# Patient Record
Sex: Male | Born: 1940 | Race: White | Hispanic: No | Marital: Married | Smoking: Former smoker
Health system: Southern US, Community
[De-identification: ages and names within clinical notes are randomized; demographics above are authoritative.]

## PROBLEM LIST (undated history)

## (undated) DIAGNOSIS — C61 Malignant neoplasm of prostate: Secondary | ICD-10-CM

## (undated) DIAGNOSIS — I2119 ST elevation (STEMI) myocardial infarction involving other coronary artery of inferior wall: Secondary | ICD-10-CM

## (undated) DIAGNOSIS — I319 Disease of pericardium, unspecified: Secondary | ICD-10-CM

## (undated) DIAGNOSIS — M1990 Unspecified osteoarthritis, unspecified site: Secondary | ICD-10-CM

## (undated) DIAGNOSIS — E785 Hyperlipidemia, unspecified: Secondary | ICD-10-CM

## (undated) DIAGNOSIS — R0602 Shortness of breath: Secondary | ICD-10-CM

## (undated) DIAGNOSIS — I10 Essential (primary) hypertension: Secondary | ICD-10-CM

## (undated) DIAGNOSIS — Z8679 Personal history of other diseases of the circulatory system: Secondary | ICD-10-CM

## (undated) DIAGNOSIS — E669 Obesity, unspecified: Secondary | ICD-10-CM

## (undated) HISTORY — PX: PROSTATECTOMY: SHX69

---

## 1997-08-25 ENCOUNTER — Emergency Department (HOSPITAL_COMMUNITY): Admission: EM | Admit: 1997-08-25 | Discharge: 1997-08-25 | Payer: Self-pay | Admitting: Emergency Medicine

## 2002-04-25 DIAGNOSIS — I319 Disease of pericardium, unspecified: Secondary | ICD-10-CM

## 2002-04-25 HISTORY — DX: Disease of pericardium, unspecified: I31.9

## 2002-04-25 HISTORY — PX: OTHER SURGICAL HISTORY: SHX169

## 2002-11-04 DIAGNOSIS — Z8679 Personal history of other diseases of the circulatory system: Secondary | ICD-10-CM

## 2002-11-04 HISTORY — DX: Personal history of other diseases of the circulatory system: Z86.79

## 2002-11-20 ENCOUNTER — Encounter: Payer: Self-pay | Admitting: Cardiology

## 2002-11-20 ENCOUNTER — Encounter: Payer: Self-pay | Admitting: Family Medicine

## 2002-11-20 ENCOUNTER — Inpatient Hospital Stay (HOSPITAL_COMMUNITY): Admission: AD | Admit: 2002-11-20 | Discharge: 2002-11-23 | Payer: Self-pay | Admitting: Emergency Medicine

## 2002-11-20 ENCOUNTER — Encounter: Payer: Self-pay | Admitting: Emergency Medicine

## 2002-11-21 ENCOUNTER — Encounter (INDEPENDENT_AMBULATORY_CARE_PROVIDER_SITE_OTHER): Payer: Self-pay | Admitting: Cardiology

## 2002-12-03 ENCOUNTER — Encounter (INDEPENDENT_AMBULATORY_CARE_PROVIDER_SITE_OTHER): Payer: Self-pay | Admitting: *Deleted

## 2002-12-03 ENCOUNTER — Encounter: Payer: Self-pay | Admitting: Cardiology

## 2002-12-03 ENCOUNTER — Encounter (INDEPENDENT_AMBULATORY_CARE_PROVIDER_SITE_OTHER): Payer: Self-pay | Admitting: Cardiology

## 2002-12-03 ENCOUNTER — Inpatient Hospital Stay (HOSPITAL_COMMUNITY): Admission: AD | Admit: 2002-12-03 | Discharge: 2002-12-11 | Payer: Self-pay | Admitting: Cardiology

## 2002-12-03 ENCOUNTER — Encounter: Payer: Self-pay | Admitting: Thoracic Surgery (Cardiothoracic Vascular Surgery)

## 2002-12-04 ENCOUNTER — Encounter: Payer: Self-pay | Admitting: Cardiology

## 2002-12-06 ENCOUNTER — Encounter: Payer: Self-pay | Admitting: Cardiology

## 2002-12-09 ENCOUNTER — Encounter (INDEPENDENT_AMBULATORY_CARE_PROVIDER_SITE_OTHER): Payer: Self-pay | Admitting: Cardiology

## 2003-01-14 ENCOUNTER — Encounter: Payer: Self-pay | Admitting: Thoracic Surgery (Cardiothoracic Vascular Surgery)

## 2003-01-14 ENCOUNTER — Encounter
Admission: RE | Admit: 2003-01-14 | Discharge: 2003-01-14 | Payer: Self-pay | Admitting: Thoracic Surgery (Cardiothoracic Vascular Surgery)

## 2003-02-25 ENCOUNTER — Ambulatory Visit (HOSPITAL_COMMUNITY): Admission: RE | Admit: 2003-02-25 | Discharge: 2003-02-25 | Payer: Self-pay | Admitting: Urology

## 2003-05-05 ENCOUNTER — Encounter (INDEPENDENT_AMBULATORY_CARE_PROVIDER_SITE_OTHER): Payer: Self-pay | Admitting: Specialist

## 2003-05-05 ENCOUNTER — Inpatient Hospital Stay (HOSPITAL_COMMUNITY): Admission: RE | Admit: 2003-05-05 | Discharge: 2003-05-08 | Payer: Self-pay | Admitting: Urology

## 2003-11-25 ENCOUNTER — Ambulatory Visit: Admission: RE | Admit: 2003-11-25 | Discharge: 2004-02-23 | Payer: Self-pay | Admitting: Radiation Oncology

## 2004-03-23 ENCOUNTER — Ambulatory Visit: Admission: RE | Admit: 2004-03-23 | Discharge: 2004-03-23 | Payer: Self-pay | Admitting: Radiation Oncology

## 2004-08-17 ENCOUNTER — Ambulatory Visit: Admission: RE | Admit: 2004-08-17 | Discharge: 2004-08-17 | Payer: Self-pay | Admitting: Radiation Oncology

## 2005-03-18 ENCOUNTER — Encounter (HOSPITAL_COMMUNITY): Admission: RE | Admit: 2005-03-18 | Discharge: 2005-04-19 | Payer: Self-pay | Admitting: Urology

## 2005-09-13 ENCOUNTER — Ambulatory Visit (HOSPITAL_COMMUNITY): Admission: RE | Admit: 2005-09-13 | Discharge: 2005-09-13 | Payer: Self-pay | Admitting: Urology

## 2005-12-15 ENCOUNTER — Ambulatory Visit (HOSPITAL_COMMUNITY): Admission: RE | Admit: 2005-12-15 | Discharge: 2005-12-15 | Payer: Self-pay | Admitting: Urology

## 2009-02-22 ENCOUNTER — Emergency Department (HOSPITAL_COMMUNITY): Admission: EM | Admit: 2009-02-22 | Discharge: 2009-02-22 | Payer: Self-pay | Admitting: Emergency Medicine

## 2010-05-15 ENCOUNTER — Encounter: Payer: Self-pay | Admitting: Urology

## 2010-09-10 NOTE — Discharge Summary (Signed)
NAME:  George French, George French NO.:  [id number]   MEDICAL RECORD NO.:  [id number]                   PATIENT TYPE:  INP   LOCATION:  3733                                 FACILITY:  [facility]   PHYSICIAN:  Darden Palmer., M.D.         DATE OF BIRTH:  10/19/1940   DATE OF ADMISSION:  12/03/2002  DATE OF DISCHARGE:  12/11/2002                                 DISCHARGE SUMMARY   FINAL DIAGNOSES:  1. Pericardial tamponade.     a. Suspected viral pericarditis recently.  2. Mild hyperlipidemia.  3. Cigarette abuse with chronic obstructive pulmonary disease.   PROCEDURE:  1. Emergency echocardiogram.  2. Creation of pericardial window by Salvatore Decent. Dorris Fetch, M.D.   HISTORY:  This 70 year old male developed pericarditis July 28.  Was  hospitalized previously.  He developed EKG changes consistent with  pericarditis, had atrial fibrillation, and had a CT scan showing a small  effusion.  During the work-up he had a slightly elevated sedimentation rate  and slightly elevated white count but no other cause for the effusion could  be found.  He was discharged home on high dose aspirin and subsequent follow-  up echocardiogram after the initial showed only a small to moderate  effusion.  He had felt well initially for a week but four days prior to  admission developed increasing shortness of breath, PND, orthopnea, as well  as increasing abdominal girth and a feeling of tightness or fullness in his  neck.  When seen in the office for follow-up he was found to have neck vein  distention, was suspected in being in tamponade, and was brought to the  hospital.  Please see the previously dictated history and physical for  remainder of the details.   HOSPITAL COURSE:  Laboratory data on admission showed a white count of  15,000, hemoglobin 12.3, hematocrit 36.6.  White count was __________ .  PT  and PTT were normal.  Glucose 140, BUN 24, creatinine 0.9.  Liver enzymes  were normal except for slightly elevated alkaline phosphatase.  He had a CT  scan showing a large effusion and echocardiogram also showed a large  effusion with tamponade with RV and RA collapse.  He was considered for an  echocardiogram guided pericardiocentesis, but did not have optimal anatomy  and thus was referred for a subxiphoid pericardial window which was done the  day of admission by Viviann Spare C. Dorris Fetch, M.D.  He had removal of  significant amounts of bloody fluid and the pericardia was also felt to be  slightly thickened.  The fluid was an exudate and was bloody.  Neutrophils  were 47%, lymphocytes 41%, monocytes 11%.  LDH was 484.  Amylase was 33.  Initial cultures showed no growth of three days and tissue culture showed no  growth.  ANA was undetectable.  Viral cultures were pending.  Pathology was  not back on the chart at the time  of discharge and will be followed up.  Following the surgery he improved, but had significant fluid retention  requiring Lasix for administration.  Lasix was administered and he was  diuresed to a weight of 217.  He also developed a significant sore throat  and had to have some __________ for that.  He had some transient atrial  fibrillation following the surgery and was continued on his Diltiazem.  He  gradually improved, however, and was discharged on December 11, 2002 in  improved condition.   ACTIVITY:  He is instructed to stay out of work for the time being.  He is  not to do heavy lifting and is to restrict his driving for several weeks.   DISCHARGE MEDICATIONS:  1. Aspirin two tablets t.i.d.  2. Colchicine 0.6 mg b.i.d.  3. Cardizem LA 240 mg daily.  4. Lasix 40 mg daily for two weeks.  5. Potassium 40 mEq daily.   FOLLOWUP:  He is to call me for an appointment in two weeks, to see Viviann Spare  C. Dorris Fetch, M.D. in four weeks.                                                Darden Palmer., M.D.    WST/MEDQ  D:  12/11/2002  T:   12/12/2002  Job:  [id number]   cc:   Teena Irani. Arlyce Dice, M.D.  [address]  [place]  [place] [address]  Fax: [phone]   Salvatore Decent. Dorris Fetch, M.D.  [address]  [place]  [place] [address]  Fax: [phone]

## 2010-09-10 NOTE — Consult Note (Signed)
NAME:  George French, George French NO.:  [id number]   MEDICAL RECORD NO.:  [id number]                   PATIENT TYPE:  INP   LOCATION:  3302                                 FACILITY:  [facility]   PHYSICIAN:  Darden Palmer., M.D.         DATE OF BIRTH:  06/12/1940   DATE OF CONSULTATION:  11/20/2002  DATE OF DISCHARGE:                                   CONSULTATION   CARDIAC CONSULTATION   REQUESTING PHYSICIAN:  Teena Irani. Arlyce Dice, M.D.   Thank you for asking me to see this 70 year old male for evaluation of chest  pain.  He began to develop midsternal chest pain radiating to the right arm  which would come and go with no real associated precipitating or relieving  factors in December 2003. He felt that the symptoms have may been worse with  shoveling snow, but noted that it was not consistently related to activity  and no exacerbations __________ or diaphoresis.  It would last for variable  periods of time and may last up to half a day at a time. He had no  diaphoresis or sweating.   He had a positive family history of heart disease and smoking and got very  little exercise.  He had an exercise treadmill test done on January 27,  walking only 4 minutes and 45 seconds to Bruce stage 2 with very poor  exercise tolerance.  He had marked __________ fatigue with no angina and had  no ST segment depression consistent with ischemia.  At the time he had no  ischemia and it was recommended that if he continued to have chest pain that  I would be glad to see him again, if needed.  He was also advised to stop  smoking.   He was at his work place last evening and had the onset of sharp severe pain  that started while at work. Described as a sharp pain that was extremely  pleuritic and he could not take a deep breath.  These symptoms intensified  overnight and he was admitted in the early morning hours. He described this  pain as pleuritic and intense associated with  severe weakness. He was  brought to the emergency room and the pain was also positional. It was not  associated with sweating. It was not described as pressure.  Three initial  sets of cardiac enzymes were negative and an EKG showed diffuse ST  elevation. He was admitted to the hospital last night and given a single  dose of Lovenox.  Since admission he developed some transient slowing of the  heart rate in the early morning hours around 5 a.m.  This was associated  with junctional rhythm and later developed some atrial fibrillation which  has been paroxysmal throughout the day.  He had some hypotension, earlier,  also in the morning associated with pain as well as some nausea.  He was  given some  intravenous fluids and had a spiral CT scan that did not show  evidence of a dissection, but did show a small pericardial effusion.  A  subsequent echocardiogram showed a small effusion without tamponade.   The patient continues to complain of severe pain which is worse when he lies  down and is severely pleuritic.  He states that he could take a deep breath  if it did not hurt so bad. He denies any recent upper respiratory infections  or viral illnesses.   PAST HISTORY:  Medical: No history of hypertension or diabetes.  He does  have mild hyperlipidemia.  Previous surgery: None.   ALLERGIES:  None.   MEDICATIONS PRIOR TO ADMISSION:  None.   FAMILY HISTORY:  Father, age 20, died of myocardial infarction and melanoma.  Mother, age 23, died of an MI.  One brother, age 37, is alive and well.   SOCIAL HISTORY:  He is divorced, but has been remarried for 10 years.  He  has smoked a pack of cigarettes per day and has greater than a 50-pack-year  history.  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 He is a native  of [place], but moved here to live with his wife.   REVIEW OF SYSTEMS:  He has been obese, but denies any recent weight change.  He denies any rashes or skin lesions. he denies  any ENT problems, history of  glaucoma or visual problems.  He has not had any recent sore throat. He does  have some dyspnea on exertion previously and states that his pain is very  pleuritic now.  CARDIOVASCULAR: Review HPI.  ABDOMEN: He has had some nausea  recently, but denies dyspepsia, ulcers or GI bleeding.  GU: He has a prior  history of an elevated PSA that has been treated.  He also has a history of  mild generalized arthritis. He denies any history of depression or change in  cognitive functions or history of headache.   PHYSICAL EXAMINATION:  GENERAL: On examination he is a pleasant male who is  mildly obese who is in moderate chest pain.  VITAL SIGNS:  His blood pressure was 117/75.  He was in atrial fibrillation  at the time and it was difficult to tell if he had any pulsus paradoxus or  not.  Pulse is currently 120.  SKIN: Warm and dry without obvious mass or lesion.  HEENT:  PERLA.  TMs are clear.  Pharynx negative.  NECK:  Supple without masses., JVD, or thyromegaly.  LUNGS:  Clear bilaterally.  He was unable to take a deep breath due to pain.  CARDIAC:  Exam shows normal S1 and S2, irregular rhythm.  There is a  scratchy murmur at the left sternal border.  ABDOMEN:  Soft, obese, and nontender.  EXTREMITIES:  Femoral pulse is 2+.  Feet are somewhat cold and peripheral  pulses are 1+.  There is brawny discoloration over the anterior shins.  NEUROLOGIC: Normal.   A 12-lead EKG shows atrial fibrillation initially.  He is sinus rhythm  initially with diffuse ST elevation consistent with pericarditis.  Repeat  EKG shows atrial fibrillation and a subsequent repeat showed him to be back  in sinus rhythm with diffuse ST elevation noted.  Chest x-ray showed mild  cardiomegaly.   LABORATORY DATA:  Shows a white count of 12,800.  All serial enzymes were  negative. Glucose was 160.   IMPRESSION:  1. Chest pain with features that suggest pericarditis.  The differential     possibility would be infectious due to a viral illness, cancer, or     inflammatory.  He did not appear to have any other comorbidities that     would go along with this.  2. Prior evaluation for chest pain.  3. History of chronic obstructive pulmonary disease and cigarette abuse.  4. Atrial fibrillation which is likely due to pericarditis.  5. Probable vagal reaction earlier in the morning with hypertension and     bradycardia.   RECOMMENDATIONS:  1. The patient appears to clinically have pericarditis.  2. It is recommended that all anticoagulation be stopped and not started.  I     would also avoid nitroglycerin or other agents that would lower blood     pressure and hydrate with intravenous fluids.  3. I would treat him with aspirin 3 tablets q.6h. in the form of high-dose     aspirin and check a follow up echogram in the morning.  4.     I would also evaluate a TSH, sedimentation rate, and may consider a TB test      although he does not have any symptoms that would suggest this and this     presentation would be unlikely.  I will follow him along with you and     appreciate seeing this nice man with you.                                               Darden Palmer., M.D.    WST/MEDQ  D:  11/20/2002  T:  11/20/2002  Job:  [id number]   cc:   Teena Irani. Arlyce Dice, M.D.  [address]  [place]  [place] [address]  Fax: [phone]

## 2010-09-10 NOTE — Discharge Summary (Signed)
NAME:  George French, George French                        ACCOUNT NO.:  [id number]   MEDICAL RECORD NO.:  [id number]                   PATIENT TYPE:  INP   LOCATION:  0382                                 FACILITY:  [facility]   PHYSICIAN:  Lindaann Slough, M.D.               DATE OF BIRTH:  08-27-1940   DATE OF ADMISSION:  05/05/2003  DATE OF DISCHARGE:  05/08/2003                                 DISCHARGE SUMMARY   DISCHARGE DIAGNOSES:  Adenocarcinoma of prostate.   PROCEDURE DONE:  Bilateral pelvic lymphadenectomy and radical prostatectomy  on May 05, 2003.   HISTORY OF PRESENT ILLNESS:  The patient is a 70 year old male who was found  to have an elevated PSA at 4.7.  A rectal examination showed an enlarged  prostate with some firmness on the right lobe of the prostate.  An  ultrasound biopsy of the prostate was positive for adenocarcinoma Gleason  score 7 and 8.  Bone scan is negative for metastatic disease.  The patient  and his wife chose to have a radical prostatectomy and he was admitted on  January 10 for the procedure.   PHYSICAL EXAMINATION:  VITAL SIGNS:  His blood pressure was 120/70, pulse  88, respirations 18, temperature 97.1.  HEENT:  Head is normal.  Pupils equal and reactive to light and  accommodation.  Ears, nose, and throat within normal limits.  NECK:  Supple.  No cervical lymph nodes.  No thyromegaly.  CHEST:  Symmetrical.  LUNGS:  Fully expanded and clear to percussion and auscultation.  HEART:  Regular rhythm.  ABDOMEN:  Soft and nondistended, nontender.  He has no CVA tenderness.  Kidneys are not palpable.  Bowel sounds normal.  GENITOURINARY:  Penis is uncircumcised.  Scrotum is unremarkable.  Testicles, cords, and epididymes are within normal limits.   LABORATORIES:  Hemoglobin on admission was 16.3, hematocrit 47.4, WBC 7000.  Sodium 138, potassium 5.0, glucose 118, BUN 29, creatinine 1.0.  Urinalysis  is normal and urine is sterile.   Chest x-ray showed  stable cardiomegaly.   EKG is normal.   HOSPITAL COURSE:  The patient had a radical prostatectomy on May 05, 2003.  Postoperative course was uneventful.  He remained afebrile throughout  the hospital course.  He was started on liquid diet the evening of the  surgery and he tolerated the diet well.  His diet was then advanced.  He  started passing flatus.  His abdomen remained soft and nondistended.  The  wound was clean and dry.  The drainage from the [facility] drain gradually  decreased and the Foley catheter drained clear urine.  His hemoglobin  postoperatively was 13.1 with a hematocrit of 38.4 and WBC 10.8.   Pathology report showed adenocarcinoma Gleason score 7 with extracapsular  extension in seminal vesicals involvement.   On May 08, 2003 he was afebrile.  The Aragon drain was removed.  He was  then discharged  home.   DISCHARGE MEDICATIONS:  1. Levaquin 250 mg p.o. daily.  2. Darvocet-N 100 one tablet q.4h. p.r.n. for pain.  3. Colace 100 mg t.i.d.  4. Diltiazem XR 240 mg daily.   DISCHARGE INSTRUCTIONS:  He will be followed in the office in two weeks for  removal of Foley catheter.  He is instructed not to do any lifting,  straining, or driving until further advised.   DIET:  Regular.   CONDITION ON DISCHARGE:  Improved.   A PSA will be obtained in six weeks and if the PSA is not undetectable he  will be referred to radiation oncology for consultation for radiation  treatment.                                               Lindaann Slough, M.D.    MN/MEDQ  D:  05/08/2003  T:  05/08/2003  Job:  [id number]   cc:   W. Ashley Royalty., M.D.  [address]  [place]  [place] [address]  Fax: [phone]   Teena Irani. Arlyce Dice, M.D.  [address]  [place]  [place] [address]  Fax: [phone]

## 2010-09-10 NOTE — Discharge Summary (Signed)
NAMEEMILIO, French NO.:  [id number]   MEDICAL RECORD NO.:  [id number]                   PATIENT TYPE:  INP   LOCATION:  2007                                 FACILITY:  [facility]   PHYSICIAN:  Lonia Blood, M.D.            DATE OF BIRTH:  24-Mar-1941   DATE OF ADMISSION:  11/19/2002  DATE OF DISCHARGE:  11/23/2002                                 DISCHARGE SUMMARY   DISCHARGE DIAGNOSES:  1. Pericarditis.  2. Tobacco abuse-one pack per day.  3. History of enlarged prostate with high PSA and negative prostate     biopsies.   DISCHARGE MEDICATIONS:  1. Aspirin 325 mg two tablets four times a day for 10 days then stop.  2. Cardizem CD 240 mg q.d.  3. Protonix 40 mg q.d.   FOLLOW UP:  The patient is instructed to follow up with his cardiologist,  Dr. Donnie Aho, one week status post discharge.   PROCEDURE:  1. Echocardiogram November 21, 2002-systolic function normal. LV ejection     fraction 55 to 65%. No regional wall motion abnormalities. Small     pericardial effusion. No significant increase in size of pericardial     effusion.  2. Echocardiogram November 20, 2002-same as above with small pericardial     effusion.   CONSULTANTS:  Georga Hacking, M.D.   HISTORY OF PRESENT ILLNESS:  [name] is a 70 year old gentleman  who presented on date of admission with complaints of acute onset of chest  pain. This was described as pain within the upper chest and neck. The  patient never experienced similar pain. It was associated with dyspnea.  There was a pleuritic component in that it hurt for the patient to take a  breath. The patient's pain was significantly increased with exertion.  Because of significant risk factors for coronary artery disease, decision  was made to admit the patient to the hospital and rule out for MI.   HOSPITAL COURSE:  [name] was admitted to a telemetry unit to rule him  out for acute MI and for risk  stratification for coronary artery disease. He  did rule out for acute MI. EKG was concerning, however, in that there was  evidence of clear ST segment elevation. This was, however, diffuse and not  localized to the distribution of a single coronary artery. Full description  of the patient's pain was most consistent with a pericarditis as was the  EKG. The patient's cardiology, Dr. Donnie Aho, was consulted for evaluation.  Decision was made to treat the patient with high dose aspirin therapy. He  had initially been placed on anticoagulation because of his chest pain, but  this was discontinued as it is contraindicated with pericarditis. With high  dose aspirin therapy, the patient's symptoms resolved. Echocardiogram did  support pericarditis and also showed a small pericardial effusion. As a  result, this was followed up 24 hours  after the initial evaluation, and  pleural effusions were proven to be stable. Further workup included an  antinuclear antibody which was negative. A sed rate was mildly elevated at  40. BNP was normal at 33. TSH was also obtained, and this was within a  normal range. Again, full panel of cardiac enzymes was obtained and was  negative. The patient was ambulated in the hallway and tolerated this  without difficulty. By July 31, the patient was cleared for discharge home.   DISPOSITION:  His discharge diagnosis is pericarditis. He is to follow up  with Dr. Donnie Aho, his cardiologist, as discussed above.                                                Lonia Blood, M.D.    JTM/MEDQ  D:  01/08/2003  T:  01/09/2003  Job:  [id number]

## 2010-09-10 NOTE — H&P (Signed)
NAME:  George French, George French                        ACCOUNT NO.:  [id number]   MEDICAL RECORD NO.:  [id number]                   PATIENT TYPE:  INP   LOCATION:  0012                                 FACILITY:  [facility]   PHYSICIAN:  Lindaann Slough, M.D.               DATE OF BIRTH:  Jul 11, 1940   DATE OF ADMISSION:  05/05/2003  DATE OF DISCHARGE:                                HISTORY & PHYSICAL   CHIEF COMPLAINT:  Prostate cancer.   HISTORY OF PRESENT ILLNESS:  The patient is a 70 year old male who was found  to have an elevated PSA.  His PSA was 4.7.  Rectal examination showed an  enlarged prostate with some firmness on the right lobe of the prostate in  the mid gland.  On ultrasound prostate biopsy of the prostate is positive  for adenocarcinoma Gleason score 7 and 8.  Bone scan is negative for  metastatic disease.  Treatment options were discussed with the patient and  he chose to have a radical prostatectomy.  He was cleared for surgery by Dr.  Viann Fish.  He is admitted today for the procedure.   PAST MEDICAL HISTORY:  He has a history of pericarditis with tamponade  treated with pericardial window that is now resolved.  He has a history of  obesity.   PAST SURGICAL HISTORY:  He had a window for pericardial tamponade.   SOCIAL HISTORY:  He has smoked about a pack a day for 45 years and does not  drink.  He is married.  Has two children.   FAMILY HISTORY:  His father died of heart failure at age 35.  His mother  died of heart disease at age 72.  His father had prostate cancer.  His  mother had diabetes.  He has one brother.   REVIEW OF SYSTEMS:  There is no cough, no shortness of breath, no  hemoptysis, no chest pain.  He has no nausea, no vomiting, no diarrhea or  constipation.  GENITOURINARY:  He has frequency, decreased stream, and  urgency.   PHYSICAL EXAMINATION:  GENERAL:  This is a well built 70 year old male in no  acute distress.  VITAL SIGNS:  120/70, pulse 88,  respirations 18, temperature 97.1, height 5  feet 8 inches, weight 230 pounds.  HEENT:  His head is normal.  Pupils are equal, round, and reactive to light  and accommodation.  Ears, nose, and throat within normal limits.  NECK:  Supple.  No cervical lymph nodes.  No thyromegaly.  CHEST:  Symmetrical.  LUNGS:  Fully expanded and clear to percussion and auscultation.  HEART:  Regular rhythm.  No murmur.  No gallops.  ABDOMEN:  Soft, nondistended, nontender.  He has no CVA tenderness.  Kidneys  are not palpable.  GENITOURINARY:  Bladder is not distended.  He has no inguinal hernia.  Penis  is uncircumcised.  Meatus is normal.  Scrotum is unremarkable.  Both  testicles, cords, and epididymes are within normal limits.  RECTAL:  Sphincter tone is normal.  Prostate is enlarged 40 g with some  firmness on the right lobe of the prostate in the mid gland.  Seminal  vesicals are not palpable.   ADMITTING DIAGNOSES:  Adenocarcinoma of prostate.                                               Lindaann Slough, M.D.    MN/MEDQ  D:  05/05/2003  T:  05/05/2003  Job:  [id number]

## 2010-09-10 NOTE — H&P (Signed)
NAME:  George French, George French NO.:  [id number]   MEDICAL RECORD NO.:  [id number]                   PATIENT TYPE:  EMS   LOCATION:  MAJO                                 FACILITY:  [facility]   PHYSICIAN:  Gloriajean Dell. Andrey Campanile, M.D.                DATE OF BIRTH:  27-May-1940   DATE OF ADMISSION:  11/19/2002  DATE OF DISCHARGE:                                HISTORY & PHYSICAL   IDENTIFICATION:  A 70 year old married white male from [place], [place].   CHIEF COMPLAINT:  Upper chest pain and neck pain, which started around 6  p.m. yesterday.   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  It hurt to breathe.  It was worse with exertion.  It felt like someone was standing on his chest.  He had never had any kind  of pain like that before.  He had not done any heavy lifting or had recent  trauma, other than a splinter in his left finger, which he had removed.  He  has not had any recent cold, cough, fever.  He came to the emergency room  where workup has been unrevealing.  He is admitted now for further  evaluation.   PAST MEDICAL HISTORY:  1. In February, due to some chest pain that he had after shoveling snow, he     had a stress test, saw a cardiologist; all was normal.  2. He smokes one pack per day of cigarettes.  3. He has not been treated for hypertension or high lipids.  4. He is followed by Dr. Dara Hoyer.  5. He takes no medicines.  6. No known drug allergies.  7. He has had an enlarged prostate, high PSA, negative prostate biopsies; a     biopsy was planned for today.   FAMILY AND SOCIAL HISTORY:  The patient is married, lives with his wife, has  children who are grown.  His father died with cancer and heart palpitations.  His mother died with some kind of heart disease.  He has one older brother  who is well.  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  He drives a  forklift sometimes.   PHYSICAL EXAMINATION:  GENERAL:  Examination reveals a well-developed white  male, lying at a 45-degree angle on a hospital gurney.  He is awakened from  sleep and then alert and oriented.  He appears short-winded.  His color is  normal.  VITAL SIGNS:  At first, temperature 100.3, later 98.3, pulse 80 and regular,  respirations 24, blood pressure 158/86, dropping to 108/65, O2 saturation at  first 94% and then 98% on room air.  HEENT:  He has dentures.  Throat looks normal.  Eyes and ears are okay.  Sinuses are nontender.  NECK:  Without adenopathy or tenderness.  It is supple.  The thyroid is not  enlarged or tender.  CHEST:  Chest wall is slightly tender, upper anterior.  There is no rash or  bruising.  BACK:  Nontender.  HEART:  Regular without murmurs or rubs or extra sounds.  LUNGS:  Clear to auscultation with decreased sounds.  ABDOMEN:  Soft and nontender without masses.  GU AND RECTAL EXAM:  Deferred and not indicated.  EXTREMITIES:  No edema.  There is good strength throughout.  No tenderness  or joint abnormalities.  Pulses in the feet are palpable but diminished.   LABORATORY DATA:  White count is 12,800, hemoglobin 15.4, MCV 90, platelets  271,000.  Sodium 135, potassium 4.2, BUN 17, creatinine 1.0, glucose 170,  calcium 8.8.  Liver function tests are normal.  Pro time, PTT are normal.  CK-MB less than 1.0, troponin less than 0.05, myoglobin 42.  Fibrin  derivative is slightly elevated at 0.68 (0-0.48).  EKG shows normal sinus  rhythm, nonspecific ST-T changes.  Chest x-ray, portable, increased heart  size; no active disease, no failure.   ASSESSMENT:  1. Chest and neck pain and dyspnea, new onset, with negative cardiac workup     so far.  2. Cigarette smoking.  3. Prostate enlargement with elevated prostate specific antigen, being     evaluated.  4. Cardiomegaly on portable chest x-ray.    PLAN:  Admit for further evaluation.  We will get PA and lateral chest x-ray  and echocardiogram.  Repeat cardiac enzymes and get sed rate.  Consider  cardiology.  We will ask hospitalist to see him in hospital.                                                 Gloriajean Dell. Andrey Campanile, M.D.    FHW/MEDQ  D:  11/20/2002  T:  11/20/2002  Job:  [id number]

## 2010-09-10 NOTE — H&P (Signed)
NAME:  George French, George French NO.:  [id number]   MEDICAL RECORD NO.:  [id number]                   PATIENT TYPE:  INP   LOCATION:  2905                                 FACILITY:  [facility]   PHYSICIAN:  W. Ashley Royalty., M.D.         DATE OF BIRTH:  [birth year]   DATE OF ADMISSION:  12/03/2002  DATE OF DISCHARGE:                                HISTORY & PHYSICAL   HISTORY:  This 70 year old male was admitted for treatment of suspected  cardiac tamponade. He has a prior history of having had a chest pain  evaluation with a negative treadmill but poor exercise tolerance in January  of this year.  He developed pericarditis July 28 and was admitted to the  hospital with definite pleuritic chest pain.  At that time he developed EKG  changes consistent with pericarditis and had atrial fibrillation and also  had a CT scan showing a small effusion.  During that workup he had a  slightly elevated sedimentation rate and a slightly elevated white count,  but no other cause for the effusion could be found. He was discharged home  on high-dose aspirin.  A subsequent follow up echo after the initial echo  showed only a small-to-moderate effusion. He had felt well, initially for a  week, but approximately 4 days ago developed increasing shortness of breath,  some PND and orthopnea as well as increasing abdominal girth and a feeling  of tightness or fullness up in his neck.  His wife has been out of town and  he was seen back in the office for a routine follow up today.  He was  complaining of increasing shortness of breath, chest pain and neck vein  distention was noted in the office and he was sent over for an  echocardiogram which showed a large effusion with early tamponade.  He is  admitted at this time for further treatment.  He has not had any chest pain  suggestive of ischemia.   PAST MEDICAL HISTORY:  His past medical history is remarkable for mild  hyperlipidemia. There is no history of hypertension or diabetes.   PAST SURGICAL HISTORY:  None.   ALLERGIES:  None.   MEDICATIONS PRIOR TO ADMISSION:  Cardizem sustained release 240 mg daily and  aspirin.   FAMILY HISTORY:  Father age 7 died of a myocardial infarction and melanoma.  Mother died of an MI at age 34.  One brother, 34, is alive and well.   SOCIAL HISTORY:  He is divorced, but has been remarried for 10 years.  He  quit smoking at the time of his pericarditis but he has greater than a 50-  pack-year history of smoking.  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   REVIEW OF SYSTEMS:  He is obese, and has gained weight recently.  He denies  any rashes or skin  lesions.  ENT: No prior history of glaucoma or visual  problems.  CARDIOVASCULAR REVIEW, HPI, ABDOMEN: He denies dyspepsia, ulcers,  or GI bleeding, but has had increasing anorexia recently.  GU: Prior history  of elevated PSA that has been evaluated.  MUSCULOSKELETAL: History of mild  generalized arthritis.  NEUROLOGIC: No history of depression or change on  cognitive function.  No history of headache.   PHYSICAL EXAMINATION:  GENERAL: On exam he is an obese male who had no chest  pain, but was moderately dyspneic, but also would become tearful and cry  easily.  VITAL SIGNS: His blood pressure was 140/80 with a 20 mm pulsus paradoxus,  pulse is 80.  SKIN: Warm and dry with no obvious masses or lesions.  HEENT:  PERLA.  CNS clear.  Fundi not examined.  NECK:  Supple without masses.  There is jugular venous distention with  positive Kussmaul's sign.  LUNGS:  Decreased breath sounds at the left base.  CARDIAC:  Exam shows distant heart sounds without murmur.  There are no rubs  heard.  ABDOMEN:  Distended, soft, and nontender.  EXTREMITIES:  Femoral pulses are 2+.  Feet are somewhat cold.  The  peripheral pulses are 1+.  There is brawny discoloration over the anterior   shins.  NEUROLOGIC: Examination is normal.  GENITOURINARY AND RECTAL: Deferred.   A 12-lead EKG shows nonspecific changes laterally and shows him to be in  normal sinus rhythm.  CT scan shows a large pericardial effusion. No masses  are noted.  Echocardiogram shows a large circumferential pericardial  effusion with right atrial collapse, early RV collapse, and abnormal  respiratory variations of mitral valve inflow pattern.  White count is  15,000.  Glucose is slightly elevated and slight elevation of alkaline  phosphatase.   IMPRESSION:  1. Early pericardial tamponade.  2. Recent pericarditis, suspected viral; rule out bacterial although no     fever; rule out cancer or inflammatory.  3. History of smoking and chronic obstructive pulmonary disease.  4. History of atrial fibrillation.   RECOMMENDATIONS:  He will be admitted.  He will be started on oxygen and  intravenous fluids.  Consult Dr. Andee Lineman for consideration of an echo guided  pericardiocentesis.                                                Darden Palmer., M.D.    WST/MEDQ  D:  12/03/2002  T:  12/03/2002  Job:  [id number]   cc:   Teena Irani. Arlyce Dice, M.D.  [address]  [place]  [place] [address]  Fax: [phone]

## 2010-09-10 NOTE — Op Note (Signed)
NAME:  George French, George French NO.:  [id number]   MEDICAL RECORD NO.:  [id number]                   PATIENT TYPE:  INP   LOCATION:  2905                                 FACILITY:  [facility]   PHYSICIAN:  Salvatore Decent. Dorris Fetch, M.D.         DATE OF BIRTH:  07/17/1940   DATE OF PROCEDURE:  12/03/2002  DATE OF DISCHARGE:                                 OPERATIVE REPORT   PREOPERATIVE DIAGNOSIS:  Cardiac tamponade.   POSTOPERATIVE DIAGNOSIS:  Cardiac tamponade.   PROCEDURE:  Subxiphoid pericardial window.   SURGEON:  Salvatore Decent. Dorris Fetch, M.D.   ANESTHESIA:  General.   FINDINGS:  Approximately 500 mL of bloody pericardial fluid, thickened  pericardium, extensive fibrinous exudate on epicardial surface.   CLINICAL NOTE:  [name] is a 70 year old gentleman who recently was  diagnosed with pericarditis.  He was treated with anti-inflammatories and  initially had improvement.  However, approximately three days prior to  admission the patient began to get progressively more short of breath.  This  progressed to the point where he presented back to Dr. Donnie Aho and was  admitted. An echocardiogram showed enlarged pericardial effusion with early  cardiac tamponade as manifested by right atrial and right ventricular  compression.  The patient was evaluated for possible pericardial centesis  but was not felt to be a candidate.  Therefore the patient was referred for  pericardial window.  The indications, risks, benefits and alternatives were  discussed in detail with the patient and his wife.  He understood and  accepted the risks of surgery, and agreed to proceed.   DESCRIPTION OF PROCEDURE:  [name] was brought to the operating room on  December 03, 2002.  Arterial blood pressure monitoring catheter was placed.  Intravenous access was obtained.  He was anesthetized and intubated.  No  hemodynamic changes of significance occurred with intubation.  The chest  and  abdomen were prepped and draped in the usual fashion.   A midline incision was made over the xiphoid, extended through the skin and  subcutaneous tissue.  The patient had very thick subcutaneous fat layer. The  xiphoid was identified.  It was bifid xiphoid.  Portions were excised with a  rongeur.  A Rultract retractor was placed under the lower portion of the  sternum and used to retract the sternum.  The dissection was difficult due  to the patient's body habitus but the pericardium was identified and  incised.  Approximately 500 mL of bloody pericardial fluid was evacuated.  This was sent for bacterial fungal viral and AFB cultures.  It was also sent  for cell counts, chemistries and cytology.  A small portion of the  pericardium measuring approximately 2 x 3 cm was excised.  A portion was  sent for tissue culture.  The remainder was sent for permanent pathology.  Hemostasis was achieved.  The 25 French right angle chest tube was placed  through  a separate incision and placed into the pericardial space along the  diaphragmatic surface.  The incision was then closed with a running #1  Vicryl fascial suture and 2-0 Vicryl subcutaneous suture and 3-0 Vicryl  subcuticular suture.  All sponge, needle  and instrument counts were correct at the end of the procedure.  The patient  remained hemodynamically stable throughout the operative procedure and was  taken from the operating room to the postanesthetic care unit in stable  condition.                                               Salvatore Decent Dorris Fetch, M.D.    SCH/MEDQ  D:  12/04/2002  T:  12/04/2002  Job:  [id number]   cc:   W. Ashley Royalty., M.D.  [address]  [place]  [place] [address]  Fax: [phone]

## 2010-09-10 NOTE — Op Note (Signed)
NAME:  George French, George French                        ACCOUNT NO.:  [id number]   MEDICAL RECORD NO.:  [id number]                   PATIENT TYPE:  INP   LOCATION:  0012                                 FACILITY:  [facility]   PHYSICIAN:  Lindaann Slough, M.D.               DATE OF BIRTH:  1940-10-14   DATE OF PROCEDURE:  05/05/2003  DATE OF DISCHARGE:                                 OPERATIVE REPORT   PREOPERATIVE DIAGNOSIS:  Adenocarcinoma of prostate.   POSTOPERATIVE DIAGNOSIS:  Adenocarcinoma of prostate.   PROCEDURE DONE:  1. Bilateral pelvic lymphadenectomy.  2. Radical prostatectomy.   SURGEON:  Lindaann Slough, M.D.   ASSISTANT:  Sigmund I. Patsi Sears, M.D.   ANESTHESIA:  General.   INDICATION:  The patient is a 70 year old male, who was diagnosed by biopsy  to have adenocarcinoma of prostate.  His PSA is 4.7, Gleason score is 7 and  8.  Treatment options were discussed with the patient, and he chose to have  a radical prostatectomy.  The risks, benefits, and alternatives have been  discussed with the patient and his wife.  They understand and are agreeable,  and he is scheduled today for the procedure.   DESCRIPTION OF PROCEDURE:  Under general anesthesia, the patient was prepped  and draped and placed in the supine position.  A #24 Foley catheter was  inserted in the bladder.  Then a longitudinal incision was made in the  suprapubic area.  The incision is carried down to the subcutaneous tissues.  The fascia was then incised, and the iliac fossae were then entered.  Bilateral pelvic lymphadenectomy was done using the pelvic sidewalls, the  iliac vessels, the obturator nerve and vessels as landmarks.  Then the  endopelvic fascia was incised from the apex to the base of the prostate.  Then the puboprostatic ligaments were incised. Then a Hohenfeltner clamp was  passed behind the dorsal vein complex, and the dorsal vein complex was  ligated with 0 Vicryl and cut in between  ligatures.  Then the Hohenfeltner  clamp was passed behind the urethra, and the anterior wall of the urethra  was incised, and the Foley catheter was pulled through the incision, and the  posterior wall of the urethra was incised.  With blunt and sharp dissection,  the lateral pedicles were incised.  Then the posterior Denonvilliers fascia  was incised.  The vas from the right side was dissected from the surrounding  tissues and incised in between hemoclips.  The same procedure was done on  the left side, and the seminal vesicles were then dissected from the  surrounding tissue.  The anterior bladder neck was incised, and the bladder  neck was dissected from the surrounding tissues and then incised.  The  posterior wall of the bladder was then dissected from the seminal vesicles,  and the specimen was removed in toto.  The mucosa of the bladder neck was  then everted with 4-0 chromic, and the bladder neck was closed down to admit  the tip of my index finger with 2-0 Vicryl.  Then a Loma Boston sound was  passed through the urethra, and five sutures of 2-0 Monocryl were passed  through the urethral stump, and a #20 [place] Foley catheter was passed  through the urethra and into the bladder.  The urethral sutures were then  approximated to the bladder neck at the 3, 5, 7, 9, and 12 o'clock  positions.  The bladder was then irrigated with normal saline, and there was  no evidence of a leak at the anastomotic site.  The wound was then irrigated  with bug juice, and a Blake drain was placed in the wound and brought out  with separate stab wound.  The recti muscles were then approximated with #0  chromic, and the fascia was closed with #0 PDS, and the skin was closed with  #4-0 Monocryl, using subcuticular sutures.   Needle, sponge, and instrument counts were correct on two occasions.  Estimated blood loss 400 mL.   The patient tolerated the procedure well and left the OR in satisfactory   condition to postanesthesia care unit.                                               Lindaann Slough, M.D.    MN/MEDQ  D:  05/05/2003  T:  05/05/2003  Job:  [id number]   cc:   W. Ashley Royalty., M.D.  [address]  [place]  [place] [address]  Fax: [phone]   Teena Irani. Arlyce Dice, M.D.  [address]  [place]  [place] [address]  Fax: [phone]

## 2011-04-05 ENCOUNTER — Emergency Department (HOSPITAL_COMMUNITY): Payer: BC Managed Care – PPO

## 2011-04-05 ENCOUNTER — Encounter: Payer: Self-pay | Admitting: Emergency Medicine

## 2011-04-05 ENCOUNTER — Encounter (HOSPITAL_COMMUNITY)
Admission: EM | Disposition: A | Discharge status: Home / Self Care | Payer: Self-pay | Source: Home / Self Care | Attending: Cardiology

## 2011-04-05 ENCOUNTER — Other Ambulatory Visit: Payer: Self-pay

## 2011-04-05 ENCOUNTER — Inpatient Hospital Stay (HOSPITAL_COMMUNITY)
Admission: EM | Admit: 2011-04-05 | Discharge: 2011-04-08 | DRG: 853 | Disposition: A | Discharge status: Home / Self Care | Payer: BC Managed Care – PPO | Attending: Cardiology | Admitting: Cardiology

## 2011-04-05 DIAGNOSIS — I10 Essential (primary) hypertension: Secondary | ICD-10-CM

## 2011-04-05 DIAGNOSIS — I219 Acute myocardial infarction, unspecified: Secondary | ICD-10-CM

## 2011-04-05 DIAGNOSIS — Z7982 Long term (current) use of aspirin: Secondary | ICD-10-CM

## 2011-04-05 DIAGNOSIS — F17200 Nicotine dependence, unspecified, uncomplicated: Secondary | ICD-10-CM | POA: Diagnosis present

## 2011-04-05 DIAGNOSIS — Z8546 Personal history of malignant neoplasm of prostate: Secondary | ICD-10-CM

## 2011-04-05 DIAGNOSIS — E669 Obesity, unspecified: Secondary | ICD-10-CM | POA: Diagnosis present

## 2011-04-05 DIAGNOSIS — I2510 Atherosclerotic heart disease of native coronary artery without angina pectoris: Secondary | ICD-10-CM

## 2011-04-05 DIAGNOSIS — I213 ST elevation (STEMI) myocardial infarction of unspecified site: Secondary | ICD-10-CM | POA: Diagnosis present

## 2011-04-05 DIAGNOSIS — E785 Hyperlipidemia, unspecified: Secondary | ICD-10-CM | POA: Insufficient documentation

## 2011-04-05 DIAGNOSIS — I2119 ST elevation (STEMI) myocardial infarction involving other coronary artery of inferior wall: Principal | ICD-10-CM

## 2011-04-05 DIAGNOSIS — I498 Other specified cardiac arrhythmias: Secondary | ICD-10-CM | POA: Diagnosis not present

## 2011-04-05 DIAGNOSIS — Z79899 Other long term (current) drug therapy: Secondary | ICD-10-CM

## 2011-04-05 DIAGNOSIS — I2582 Chronic total occlusion of coronary artery: Secondary | ICD-10-CM | POA: Diagnosis present

## 2011-04-05 HISTORY — DX: Unspecified osteoarthritis, unspecified site: M19.90

## 2011-04-05 HISTORY — PX: LEFT HEART CATHETERIZATION WITH CORONARY ANGIOGRAM: SHX5451

## 2011-04-05 HISTORY — DX: ST elevation (STEMI) myocardial infarction involving other coronary artery of inferior wall: I21.19

## 2011-04-05 HISTORY — PX: CORONARY STENT PLACEMENT: SHX1402

## 2011-04-05 HISTORY — DX: Personal history of other diseases of the circulatory system: Z86.79

## 2011-04-05 HISTORY — PX: PERCUTANEOUS CORONARY STENT INTERVENTION (PCI-S): SHX5485

## 2011-04-05 HISTORY — PX: TEMPORARY PACEMAKER INSERTION: SHX5471

## 2011-04-05 HISTORY — DX: Hyperlipidemia, unspecified: E78.5

## 2011-04-05 HISTORY — DX: Malignant neoplasm of prostate: C61

## 2011-04-05 HISTORY — DX: Shortness of breath: R06.02

## 2011-04-05 HISTORY — DX: Obesity, unspecified: E66.9

## 2011-04-05 HISTORY — DX: Disease of pericardium, unspecified: I31.9

## 2011-04-05 HISTORY — DX: Essential (primary) hypertension: I10

## 2011-04-05 LAB — POCT I-STAT, CHEM 8
BUN: 25 mg/dL — ABNORMAL HIGH (ref 6–23)
Calcium, Ion: 1.18 mmol/L (ref 1.12–1.32)
Chloride: 103 mEq/L (ref 96–112)
Creatinine, Ser: 1 mg/dL (ref 0.50–1.35)
Glucose, Bld: 110 mg/dL — ABNORMAL HIGH (ref 70–99)
HCT: 42 % (ref 39.0–52.0)
Hemoglobin: 14.3 g/dL (ref 13.0–17.0)
Potassium: 4.6 mEq/L (ref 3.5–5.1)
Sodium: 140 mEq/L (ref 135–145)
TCO2: 30 mmol/L (ref 0–100)

## 2011-04-05 LAB — CBC
HCT: 41.2 % (ref 39.0–52.0)
Hemoglobin: 13.9 g/dL (ref 13.0–17.0)
MCH: 30.2 pg (ref 26.0–34.0)
MCHC: 33.7 g/dL (ref 30.0–36.0)
MCV: 89.6 fL (ref 78.0–100.0)
Platelets: 233 10*3/uL (ref 150–400)
RBC: 4.6 MIL/uL (ref 4.22–5.81)
RDW: 13.8 % (ref 11.5–15.5)
WBC: 9.8 10*3/uL (ref 4.0–10.5)

## 2011-04-05 LAB — BASIC METABOLIC PANEL
BUN: 22 mg/dL (ref 6–23)
Calcium: 9.1 mg/dL (ref 8.4–10.5)
Creatinine, Ser: 0.91 mg/dL (ref 0.50–1.35)
GFR calc Af Amer: >90 mL/min (ref >90)

## 2011-04-05 LAB — TROPONIN I: Troponin I: <0.3 ng/mL (ref <0.30)

## 2011-04-05 SURGERY — LEFT HEART CATHETERIZATION WITH CORONARY ANGIOGRAM
Anesthesia: LOCAL

## 2011-04-05 MED ORDER — HEPARIN SODIUM (PORCINE) 5000 UNIT/ML IJ SOLN
INTRAMUSCULAR | Status: AC
  Administered 2011-04-05: 20:00:00
  Filled 2011-04-05: qty 1

## 2011-04-05 MED ORDER — ASPIRIN 81 MG PO CHEW: 324.0000 mg | CHEWABLE_TABLET | ORAL | Status: DC

## 2011-04-05 MED ORDER — ATROPINE SULFATE 1 MG/ML IJ SOLN
INTRAMUSCULAR | Status: AC
  Filled 2011-04-05: qty 1

## 2011-04-05 MED ORDER — HEPARIN SOD (PORCINE) IN D5W 100 UNIT/ML IV SOLN
1150.0000 [IU]/h | INTRAVENOUS | Status: AC
  Administered 2011-04-05: 1150 [IU]/h via INTRAVENOUS

## 2011-04-05 MED ORDER — BIVALIRUDIN 250 MG IV SOLR
INTRAVENOUS | Status: AC
  Filled 2011-04-05: qty 250

## 2011-04-05 MED ORDER — HEPARIN SOD (PORCINE) IN D5W 100 UNIT/ML IV SOLN: 1150.0000 [IU]/h | INTRAVENOUS | Status: DC

## 2011-04-05 MED ORDER — BIVALIRUDIN 250 MG IV SOLR
INTRAVENOUS | Status: AC
  Administered 2011-04-05: 0.25 mg
  Filled 2011-04-05: qty 250

## 2011-04-05 MED ORDER — MIDAZOLAM HCL 2 MG/2ML IJ SOLN
INTRAMUSCULAR | Status: AC
  Filled 2011-04-05: qty 2

## 2011-04-05 MED ORDER — HEPARIN BOLUS VIA INFUSION
4000.0000 [IU] | Freq: Once | INTRAVENOUS | Status: AC
  Administered 2011-04-05: 4000 [IU] via INTRAVENOUS

## 2011-04-05 MED ORDER — ASPIRIN 81 MG PO CHEW
324.0000 mg | CHEWABLE_TABLET | Freq: Once | ORAL | Status: AC
  Administered 2011-04-05: 324 mg via ORAL
  Filled 2011-04-05: qty 4

## 2011-04-05 MED ORDER — FENTANYL CITRATE 0.05 MG/ML IJ SOLN
INTRAMUSCULAR | Status: AC
  Filled 2011-04-05: qty 2

## 2011-04-05 MED ORDER — NITROGLYCERIN 0.2 MG/ML ON CALL CATH LAB
INTRAVENOUS | Status: AC
  Filled 2011-04-05: qty 1

## 2011-04-05 MED ORDER — NITROGLYCERIN IN D5W 200-5 MCG/ML-% IV SOLN
INTRAVENOUS | Status: AC
  Administered 2011-04-05: 50000 ug
  Filled 2011-04-05: qty 250

## 2011-04-05 MED ORDER — ONDANSETRON HCL 4 MG/2ML IJ SOLN: 4.0000 mg | Freq: Four times a day (QID) | INTRAMUSCULAR | Status: DC | PRN

## 2011-04-05 MED ORDER — ROSUVASTATIN CALCIUM 40 MG PO TABS
40.0000 mg | ORAL_TABLET | Freq: Every day | ORAL | Status: DC
  Administered 2011-04-06: 40 mg via ORAL
  Filled 2011-04-05 (×2): qty 1

## 2011-04-05 MED ORDER — ASPIRIN EC 81 MG PO TBEC
81.0000 mg | DELAYED_RELEASE_TABLET | Freq: Every day | ORAL | Status: DC
  Administered 2011-04-06 – 2011-04-07 (×2): 81 mg via ORAL
  Filled 2011-04-05 (×2): qty 1

## 2011-04-05 MED ORDER — HEPARIN (PORCINE) IN NACL 2-0.9 UNIT/ML-% IJ SOLN
INTRAMUSCULAR | Status: AC
  Filled 2011-04-05: qty 2000

## 2011-04-05 MED ORDER — NITROGLYCERIN 0.4 MG SL SUBL
0.4000 mg | SUBLINGUAL_TABLET | SUBLINGUAL | Status: DC | PRN
  Filled 2011-04-05: qty 25

## 2011-04-05 MED ORDER — TICAGRELOR 90 MG PO TABS
90.0000 mg | ORAL_TABLET | Freq: Two times a day (BID) | ORAL | Status: DC
  Filled 2011-04-05: qty 1

## 2011-04-05 MED ORDER — ROSUVASTATIN CALCIUM 20 MG PO TABS: 20.0000 mg | ORAL_TABLET | ORAL | Status: DC

## 2011-04-05 MED ORDER — ASPIRIN 325 MG PO TABS: 325.0000 mg | ORAL_TABLET | Freq: Every day | ORAL | Status: DC

## 2011-04-05 MED ORDER — MORPHINE SULFATE 2 MG/ML IJ SOLN: 2.0000 mg | INTRAMUSCULAR | Status: DC | PRN

## 2011-04-05 MED ORDER — SODIUM CHLORIDE 0.9 % IV SOLN
INTRAVENOUS | Status: DC
  Administered 2011-04-05: via INTRAVENOUS

## 2011-04-05 MED ORDER — HEPARIN SOD (PORCINE) IN D5W 100 UNIT/ML IV SOLN
1150.0000 [IU]/h | INTRAVENOUS | Status: DC
  Filled 2011-04-05: qty 250

## 2011-04-05 MED ORDER — NITROGLYCERIN 0.4 MG SL SUBL: 0.4000 mg | SUBLINGUAL_TABLET | SUBLINGUAL | Status: DC | PRN

## 2011-04-05 MED ORDER — LIDOCAINE HCL (PF) 1 % IJ SOLN
INTRAMUSCULAR | Status: AC
  Filled 2011-04-05: qty 30

## 2011-04-05 MED ORDER — SODIUM CHLORIDE 0.9 % IV SOLN
250.0000 mL | INTRAVENOUS | Status: DC | PRN
  Administered 2011-04-06: 10 mL/h via INTRAVENOUS

## 2011-04-05 MED ORDER — ACETAMINOPHEN 325 MG PO TABS: 650.0000 mg | ORAL_TABLET | ORAL | Status: DC | PRN

## 2011-04-05 MED ORDER — SODIUM CHLORIDE 0.9 % IJ SOLN
3.0000 mL | Freq: Two times a day (BID) | INTRAMUSCULAR | Status: DC
  Administered 2011-04-06 – 2011-04-07 (×3): 3 mL via INTRAVENOUS
  Filled 2011-04-05: qty 3

## 2011-04-05 MED ORDER — NITROGLYCERIN IN D5W 200-5 MCG/ML-% IV SOLN: 2.0000 ug/min | INTRAVENOUS | Status: DC

## 2011-04-05 MED ORDER — SODIUM CHLORIDE 0.9 % IJ SOLN
3.0000 mL | INTRAMUSCULAR | Status: DC | PRN
  Filled 2011-04-05: qty 3

## 2011-04-05 MED ORDER — ZOLPIDEM TARTRATE 5 MG PO TABS: 5.0000 mg | ORAL_TABLET | Freq: Every evening | ORAL | Status: DC | PRN

## 2011-04-05 MED ORDER — TICAGRELOR 90 MG PO TABS
ORAL_TABLET | ORAL | Status: AC
  Filled 2011-04-05: qty 2

## 2011-04-05 NOTE — ED Notes (Signed)
PT. ARRIVED WITH EMS FROM HOME , REPORTS SUBSTERNAL CHEST PAIN RADIATING TO LEFT ARM ONSET THIS EVENING , SLIGHT SOB , DENIES NAUSEA OR DIAPHORESIS , TOOK 1 REGULAR ASA PRIOR TO ARRIVAL .

## 2011-04-05 NOTE — Progress Notes (Signed)
ANTICOAGULATION CONSULT NOTE - Initial Consult  Pharmacy Consult for Heparin  Indication: STEMI  No Known Allergies  Patient Measurements: Ht: 68"; Wt: 110Kg; IBW: 68 Kg; Heparin dosing wt: 93kg (obtained from pt wife at bedside)  Vital Signs: Temp: 97.7 F (36.5 C) (12/George 1926) Temp src: Oral (12/George 1926) BP: 126/63 mmHg (12/George 1926) Pulse Rate: 58  (12/George 1926)  Labs: No results found for this basename: HGB:2,HCT:3,PLT:3,APTT:3,LABPROT:3,INR:3,HEPARINUNFRC:3,CREATININE:3,CKTOTAL:3,CKMB:3,TROPONINI:3 in the last 72 hours CrCl is unknown because no creatinine reading has been taken and the patient has no height on file.  Medical History: Past Medical History  Diagnosis Date  . Hypertension   . Pericarditis   . Prostate cancer     Medications:  PTA meds reviewed, noted ASA 325mg  taken today  Assessment: 70 French with known CAD admitted with chest pain, found to have ST elevation. Received orders for STAT IV heparin. No anticoagulants PTA- Heparin 4000 unit IV bolus given per ED MD.    Goal of Therapy:  Heparin level 0.3-0.7 units/ml   Plan:  1. Heparin drip at 1150 units/hr, start ASAP 2. Heparin level at 0200, 12/12- or f/up post cath today (code STEMI activated) 3. Daily heparin level and CBC as needed.  Phelicia Dantes K. Allena Katz, PharmD, BCPS.  Clinical Pharmacist Pager [phone]. 12/George/2012 8:09 PM

## 2011-04-05 NOTE — ED Notes (Signed)
Chest pain today took own aspirin prior to arrival 325mg . Patient stated chest pain 6-7/10 when laying down shortness of breath. EMS arrived gave one sL nitro and pain improved to 3/10. EMS gave second nitro no relief. Patient ax4 IV left hand 20g EKG sinus brady and normal sinus.

## 2011-04-05 NOTE — ED Provider Notes (Signed)
I saw and evaluated the patient, reviewed the resident's note and I agree with the findings and plan. 70 year old, male, who smoked until a month ago, complains of left-sided chest pain that goes into his left arm with nausea and shortness of breath.  No diaphoresis.  He has never had an MI in the past.  Creatinine care wall MI on his EKGs.  We started heparin, nitroglycerin, and perform laboratory tests EKGs and chest x-ray in emergency department.  I spoke with Dr. Tresa Endo.  He agreed to take the patient to the Cath Lab for evaluation and treatment.    Nicholes Stairs, MD 04/05/11 2051

## 2011-04-05 NOTE — H&P (Signed)
Admit date: 04/05/2011 Referring Physician   Primary Cardiologist  Dr. Viann Fish Chief complaint/reason for admission:STEMI/Chest pain  HPI: This is a 69yo male with a history of remote pericarditis with pericardial tamponade s/p pericardial window in 2004, HTN and dyslipidemia who presented to the ER with complaints of chest pain and EKG showed acute inferior STEMI.  According to his wife he was feeling well tonight.  She was out and had talked with him 1/2 hour before EMS was called.  Apparently he developed chest tightness and laid down but the pain got worse and he called EMS.  He has not complained of any chest pain prior to this but has had some exertional fatigue which she says is chronic.    PMH:    Past Medical History  Diagnosis Date  . Hypertension   . Pericarditis   . Prostate cancer     PSH:    Past Surgical History  Procedure Date  . Prostatectomy   . S/p pericardial window 2004    ALLERGIES:   Review of patient's allergies indicates no known allergies.  Prior to Admit Meds:   (Not in a hospital admission) Family HX:   History reviewed. No pertinent family history. Social HX:    History   Social History  . Marital Status: Married    Spouse Name: N/A    Number of Children: N/A  . Years of Education: N/A   Occupational History  . Not on file.   Social History Main Topics  . Smoking status: Never Smoker   . Smokeless tobacco: Not on file  . Alcohol Use: No  . Drug Use: No  . Sexually Active:    Other Topics Concern  . Not on file   Social History Narrative  . No narrative on file     ROS:  All 11 ROS were addressed and are negative except what is stated in the HPI  PHYSICAL EXAM Filed Vitals:   04/05/11 2009  BP: 134/69  Pulse: 60  Temp:    General: Well developed, well nourished, in no acute distress Head: Eyes PERRLA, No xanthomas.   Normal cephalic and atramatic  Lungs:   Clear bilaterally to auscultation and percussion. Heart:   HRRR  S1 S2 Pulses are 2+ & equal.            No carotid bruit. No JVD.  No abdominal bruits. No femoral bruits. Abdomen: Bowel sounds are positive, abdomen soft and non-tender without masses  Extremities:   No clubbing, cyanosis or edema.  DP +1 Neuro: Alert and oriented X 3. Psych:  Good affect, responds appropriately   Labs:   Lab Results  Component Value Date   WBC 9.8 04/05/2011   HGB 14.3 04/05/2011   HCT 42.0 04/05/2011   MCV 89.6 04/05/2011   PLT 233 04/05/2011    Lab 04/05/11 2042 04/05/11 1945  NA 140 --  K 4.6 --  CL 103 --  CO2 -- 28  BUN 25* --  CREATININE 1.00 --  CALCIUM -- 9.1  PROT -- --  BILITOT -- --  ALKPHOS -- --  ALT -- --  AST -- --  GLUCOSE 110* --   Lab Results  Component Value Date   TROPONINI <0.30 04/05/2011       Radiology:  *RADIOLOGY REPORT*  Clinical Data: Chest pain.  PORTABLE CHEST - 1 VIEW  Comparison: None.  Findings: Heart is borderline enlarged. Lungs are clear. No  effusions. No acute bony abnormality.  IMPRESSION:  Borderline cardiomegaly. No active disease.  Original Report Authenticated By: Cyndie Chime, M.D.   EKG:  Sinus bradycardia with ST elevation 1mm ST elevation in inferior leads  ASSESSMENT:  1.  Acute STEMI 2.  History of pericarditis s/p pericardial window 2004 3.  Prostate CA s/p prostatectomy 4.  HTN 5.  dyslipidemia  PLAN:   1.  Emergent cardiac cath  Per Dr. Nicholaus Bloom 2.  IV Heparin gtt 3.  No beta blocker due to bradycardia 4.  ASA/IV NTG gtt  Quintella Reichert, MD  04/05/2011  8:51 PM

## 2011-04-05 NOTE — Brief Op Note (Signed)
04/05/2011  10:18 PM  PATIENT:  George French  70 y.o. male  PRE-OPERATIVE DIAGNOSIS:  stemi  Emergent Cardiac catherization/ Temporary Pacemaker/ PCI/Stent to Mid LCX.    DICTATION # [id number], [id number]  Lennette Bihari, MD, [facility] 04/05/2011 10:19 PM

## 2011-04-05 NOTE — ED Notes (Signed)
[facility] called to page stemi

## 2011-04-05 NOTE — ED Provider Notes (Signed)
History     CSN: [id number] Arrival date & time: 04/05/2011  7:01 PM   First MD Initiated Contact with Patient 04/05/11 1909      Chief Complaint  Patient presents with  . Code STEMI    (Consider location/radiation/quality/duration/timing/severity/associated sxs/prior treatment) The history is provided by the patient and the spouse.   patient is a 70 year old male presents with chest pain. He states it started earlier today, about 9 hours prior to arrival. It lasts about 30 minutes at that time and resolved on its own. Patient then had recurrence of chest pain about 3 hours prior to arrival. His chest pain is located in the left side of his chest and radiates to his left upper extremity. He describes it as pressure-like. He notes associated dyspnea, diaphoresis and mild nausea. Exertion makes this worse. No recent fever or cough. Patient has never had a heart attack.  The only other time he has had chest pain was 4 years ago when he had viral pericarditis. Cross severity described as moderate. When evaluated in the emergency department he rated the pain as 3/10.  Past Medical History  Diagnosis Date  . Hypertension   . Pericarditis   . Prostate cancer   . Shortness of breath   . Arthritis     Past Surgical History  Procedure Date  . Prostatectomy   . S/p pericardial window 2004    History reviewed. No pertinent family history.  History  Substance Use Topics  . Smoking status: Former Smoker -- 1.5 packs/day for 53 years    Types: Cigarettes    Quit date: 02/22/2011  . Smokeless tobacco: Not on file  . Alcohol Use: No     Quit drinking coffee brandy, scotch and beer in 1994      Review of Systems  Constitutional: Negative for fever, chills and activity change.  HENT: Negative for congestion and neck pain.   Respiratory: Positive for shortness of breath. Negative for cough, chest tightness and wheezing.   Cardiovascular: Positive for chest pain. Negative for leg  swelling.  Gastrointestinal: Negative for nausea, vomiting, abdominal pain, diarrhea and abdominal distention.  Genitourinary: Negative for difficulty urinating.  Musculoskeletal: Negative for gait problem.  Skin: Negative for rash.  Neurological: Negative for weakness and numbness.  Psychiatric/Behavioral: Negative for behavioral problems and confusion.  All other systems reviewed and are negative.    Allergies  Review of patient's allergies indicates no known allergies.  Home Medications  No current outpatient prescriptions on file.  BP 97/57  Pulse 56  Temp(Src) 98.4 F (36.9 C) (Oral)  Resp 16  Ht 5\' 8"  (1.727 m)  Wt 237 lb 7 oz (107.7 kg)  BMI 36.10 kg/m2  SpO2 99%  Physical Exam  Nursing note and vitals reviewed. Constitutional: He is oriented to person, place, and time. He appears well-developed and well-nourished. No distress.  HENT:  Head: Normocephalic.  Nose: Nose normal.  Eyes: EOM are normal. Pupils are equal, round, and reactive to light.  Neck: Normal range of motion. Neck supple. No JVD present.  Cardiovascular: Normal rate, regular rhythm and intact distal pulses.   No murmur heard.      Heart rate and low 60s  Pulmonary/Chest: Effort normal and breath sounds normal. No respiratory distress. He exhibits no tenderness.  Abdominal: Soft. Bowel sounds are normal. He exhibits no distension. There is no tenderness.  Musculoskeletal: Normal range of motion. He exhibits no edema and no tenderness.       No calf  ttp  Neurological: He is alert and oriented to person, place, and time.       Normal strength  Skin: Skin is warm and dry. He is not diaphoretic.  Psychiatric: He has a normal mood and affect. His behavior is normal. Thought content normal.    ED Course  Procedures (including critical care time)   Date: 04/05/2011 at 1946  Rate: 59  Rhythm: sinus bradycardia  QRS Axis: normal  Intervals: normal  ST/T Wave abnormalities: ST elevations anteriorly  and ST elevations laterally  Conduction Disutrbances:none  Narrative Interpretation: acute inferiolateral STEMI  Old EKG Reviewed: none available  Repeat (right sided)  Date: 04/05/2011 2001  Rate: 60s  Rhythm: normal sinus rhythm  QRS Axis: normal  Intervals: normal  ST/T Wave abnormalities: ST elevations inferiorly  Conduction Disutrbances:none  Narrative Interpretation: inferior STEMI; no R sided elevation  Old EKG Reviewed: Persistent inferior ST elevation      Labs Reviewed  BASIC METABOLIC PANEL - Abnormal; Notable for the following:    Glucose, Bld 115 (*)    GFR calc non Af Amer 84 (*)    All other components within normal limits  POCT I-STAT, CHEM 8 - Abnormal; Notable for the following:    BUN 25 (*)    Glucose, Bld 110 (*)    All other components within normal limits  CBC  TROPONIN I  CBC  BASIC METABOLIC PANEL  MRSA PCR SCREENING  CARDIAC PANEL(CRET KIN+CKTOT+MB+TROPI)  CARDIAC PANEL(CRET KIN+CKTOT+MB+TROPI)  CARDIAC PANEL(CRET KIN+CKTOT+MB+TROPI)  PROTIME-INR  APTT  CBC  DIFFERENTIAL  COMPREHENSIVE METABOLIC PANEL  MAGNESIUM  HEMOGLOBIN A1C  OCCULT BLOOD X 1 CARD TO LAB, STOOL   Dg Chest Portable 1 View  04/05/2011  *RADIOLOGY REPORT*  Clinical Data: Chest pain.  PORTABLE CHEST - 1 VIEW  Comparison: None.  Findings: Heart is borderline enlarged.  Lungs are clear.  No effusions.  No acute bony abnormality.  IMPRESSION: Borderline cardiomegaly.  No active disease.  Original Report Authenticated By: Cyndie Chime, M.D.     1. Myocardial infarction       MDM   Patient presented with typical angina symptoms. Risk factors included smoking and hyperlipidemia. Initial EKG shows ST elevation in inferior and lateral distribution. Code STEMI called. Aspirin and heparin given. Patient had improvement in symptoms with nitroglycerin and maintained blood pressure. With history of pericarditis, bedside ultrasound done and was negative for significant  pericarditis. Chest x-ray negative for widened mediastinum. Patient taken to cath lab.  Hemodynamically stable while in emergency department.        Milus Glazier 04/06/11 0030

## 2011-04-05 NOTE — Progress Notes (Signed)
Aged for patient while in ED with another patient. Was unable to visit patient until cath procedure was almost over. Checked with staff on status of patient. Looked for patient's wife but unable to locate.Will follow up if possible.

## 2011-04-06 ENCOUNTER — Encounter (HOSPITAL_COMMUNITY): Payer: Self-pay | Admitting: Cardiology

## 2011-04-06 DIAGNOSIS — I2510 Atherosclerotic heart disease of native coronary artery without angina pectoris: Secondary | ICD-10-CM

## 2011-04-06 DIAGNOSIS — E669 Obesity, unspecified: Secondary | ICD-10-CM | POA: Insufficient documentation

## 2011-04-06 LAB — COMPREHENSIVE METABOLIC PANEL
ALT: 13 U/L (ref 0–53)
AST: 32 U/L (ref 0–37)
CO2: 22 mEq/L (ref 19–32)
Calcium: 8.2 mg/dL — ABNORMAL LOW (ref 8.4–10.5)
Chloride: 104 mEq/L (ref 96–112)
Creatinine, Ser: 0.72 mg/dL (ref 0.50–1.35)
GFR calc Af Amer: >90 mL/min (ref >90)
GFR calc non Af Amer: >90 mL/min (ref >90)
Glucose, Bld: 96 mg/dL (ref 70–99)
Total Bilirubin: 0.3 mg/dL (ref 0.3–1.2)

## 2011-04-06 LAB — CBC
HCT: 36.9 % — ABNORMAL LOW (ref 39.0–52.0)
HCT: 37.2 % — ABNORMAL LOW (ref 39.0–52.0)
Hemoglobin: 12.4 g/dL — ABNORMAL LOW (ref 13.0–17.0)
Hemoglobin: 12.9 g/dL — ABNORMAL LOW (ref 13.0–17.0)
MCH: 31.2 pg (ref 26.0–34.0)
MCHC: 34.7 g/dL (ref 30.0–36.0)
MCV: 88.7 fL (ref 78.0–100.0)
RBC: 4.16 MIL/uL — ABNORMAL LOW (ref 4.22–5.81)
RDW: 14 % (ref 11.5–15.5)
WBC: 6.9 10*3/uL (ref 4.0–10.5)

## 2011-04-06 LAB — BASIC METABOLIC PANEL
CO2: 24 mEq/L (ref 19–32)
Calcium: 7.3 mg/dL — ABNORMAL LOW (ref 8.4–10.5)
Chloride: 107 mEq/L (ref 96–112)
Glucose, Bld: 107 mg/dL — ABNORMAL HIGH (ref 70–99)
Sodium: 138 mEq/L (ref 135–145)

## 2011-04-06 LAB — POCT I-STAT, CHEM 8
Creatinine, Ser: 0.7 mg/dL (ref 0.50–1.35)
HCT: 39 % (ref 39.0–52.0)
Hemoglobin: 13.3 g/dL (ref 13.0–17.0)
Sodium: 124 mEq/L — ABNORMAL LOW (ref 135–145)
TCO2: 22 mmol/L (ref 0–100)

## 2011-04-06 LAB — DIFFERENTIAL
Eosinophils Relative: 1 % (ref 0–5)
Lymphocytes Relative: 27 % (ref 12–46)
Lymphs Abs: 1.9 10*3/uL (ref 0.7–4.0)
Monocytes Absolute: 0.4 10*3/uL (ref 0.1–1.0)
Monocytes Relative: 6 % (ref 3–12)
Neutro Abs: 4.5 10*3/uL (ref 1.7–7.7)

## 2011-04-06 LAB — HEMOGLOBIN A1C
Hgb A1c MFr Bld: 6.3 % — ABNORMAL HIGH (ref <5.7)
Mean Plasma Glucose: 134 mg/dL — ABNORMAL HIGH (ref <117)

## 2011-04-06 LAB — CARDIAC PANEL(CRET KIN+CKTOT+MB+TROPI)
CK, MB: 48.2 ng/mL (ref 0.3–4.0)
Relative Index: 11.5 — ABNORMAL HIGH (ref 0.0–2.5)
Relative Index: 12 — ABNORMAL HIGH (ref 0.0–2.5)
Total CK: 420 U/L — ABNORMAL HIGH (ref 7–232)
Total CK: 783 U/L — ABNORMAL HIGH (ref 7–232)
Troponin I: 8.87 ng/mL (ref <0.30)

## 2011-04-06 LAB — APTT: aPTT: 82 seconds — ABNORMAL HIGH (ref 24–37)

## 2011-04-06 LAB — POCT ACTIVATED CLOTTING TIME: Activated Clotting Time: 430 seconds

## 2011-04-06 LAB — MRSA PCR SCREENING: MRSA by PCR: NEGATIVE

## 2011-04-06 MED ORDER — ENOXAPARIN SODIUM 40 MG/0.4ML ~~LOC~~ SOLN
40.0000 mg | SUBCUTANEOUS | Status: DC
  Administered 2011-04-07: 40 mg via SUBCUTANEOUS
  Filled 2011-04-06 (×2): qty 0.4

## 2011-04-06 MED ORDER — METOPROLOL TARTRATE 25 MG PO TABS
25.0000 mg | ORAL_TABLET | Freq: Two times a day (BID) | ORAL | Status: DC
  Administered 2011-04-06 – 2011-04-08 (×4): 25 mg via ORAL
  Filled 2011-04-06 (×5): qty 1

## 2011-04-06 MED ORDER — TICAGRELOR 90 MG PO TABS
90.0000 mg | ORAL_TABLET | Freq: Two times a day (BID) | ORAL | Status: DC
  Administered 2011-04-06 – 2011-04-07 (×3): 90 mg via ORAL
  Filled 2011-04-06 (×2): qty 1

## 2011-04-06 MED FILL — Dextrose Inj 5%: INTRAVENOUS | Qty: 50 | Status: AC

## 2011-04-06 NOTE — Cardiovascular Report (Signed)
NAME:  [name], [name] NO.:  [id number]  MEDICAL RECORD NO.:  [id number]  LOCATION:  2902                         FACILITY:  [facility]  PHYSICIAN:  Nicki Guadalajara, M.D.     DATE OF BIRTH:  10/07/1940  DATE OF PROCEDURE:  04/05/2011 DATE OF DISCHARGE:                           CARDIAC CATHETERIZATION   INDICATIONS:  George French is a 70 year old gentleman who has a remote history of cardiac tamponade requiring a pericardial window in 2004.  The patient has a history of hypertension and hyperlipidemia.  He was unaware of other cardiac history.  This evening at approximately 5:30 p.m. he began to notice chest pressure.  He ultimately presented to [facility] Emergency Room where a code STEMI was called due to 1 mm ST elevation in leads 2, 3, and F.  The patient was taken to the cardiac catheterizations laboratory for acute catheterization.  PROCEDURE:  Upon arrival to the catheterization laboratory, the patient still had chest pain.  Right femoral artery was punctured anteriorly and a 6-French sheath was inserted.  Versed 2 mg plus 25 mcg of fentanyl were administered.  Selective angiography in the coronary arteries were done utilizing 6-French Judkins 4 left and right coronary catheters. With the demonstration of initial total occlusion of the AV groove circumflex after a large marginal vessel which then opened to 99% following the initial injection, the decision was made to perform intervention to this vessel.  A 6-French XB 4 guide was used.  The patient was given Angiomax and ACT was documented to be therapeutic.  He received 180 mg of oral Brilinta for antiplatelet therapy.  He had apparently arrived on IV heparin from the emergency room.  A Prowater wire was ultimately able to navigate across the subtotal to total occlusion into the distal circumflex.  The lesion extended to the bifurcation of a distal marginal vessel and distal circumflex.  Initial dilatation  was done utilizing a 2.0 x 12 mm Emerge balloon.  It was felt that this vessel should be stented with the stent being placed right at the bifurcation, so as not to jail each branch.  A 2.5 x 12 mm PROMUS Element DES stent was then successfully inserted and positioned appropriately with the distal aspect of the stent ending just above the bifurcation.  This was dilated x2 up to 14 atmospheres.  At this time, the catheters were getting soft.  The wire did pullback.  New wire was necessary to be advanced into the angled AV groove, and ultimately an Asahi medium wire was necessary.  Post dilatation was done utilizing a 2.75 x 8 mm [place] Quantum balloon.  Scout angiography confirmed an excellent angiographic result.  A 6-French pigtail catheter was then used for biplane cine left ventriculography.  Of note, with the initial reperfusion with the balloon, the patient did develop profound bradycardia with heart rates into the 20s and 30s.  A temporary pacemaker was inserted at this point via the right femoral vein and advanced through the RV apex, with documentation of excellent capture. Throughout the remaining portion of the intervention, the patient intermittently used the pacemaker.  However, at the end of the procedure, the patient was no longer  using the pacemaker.  He was pain free and had stable hemodynamics.  Subsequently, the pacemaker was removed at the completion of the procedure.  The arterial and venous sheaths were sutured in place.  The patient will be maintained on IV Angiomax for approximately 4 hours post procedure.  HEMODYNAMIC DATA:  Central aortic pressure was 100/55, left ventricular pressure 112, post A-wave 18.  ANGIOGRAPHIC DATA:  Left main coronary artery was angiographically normal.  It bifurcated into an LAD and left circumflex system.  The LAD had proximal 30% smooth narrowing before the septal perforating artery.  After the septal perforating artery, there was a  smooth 40-50% narrowing before a diagonal vessel.  The remainder of the LAD was free of significant disease.  The circumflex vessel was moderate-sized vessel.  There was an initial small first marginal vessel, followed by a second marginal vessel.  The third marginal vessel was very large.  The AV groove circumflex arose at a sharp angle from this and was initially totally occluded in the mid AV groove circumflex.  Following the initial injection, the vessel was 99% stenosed extending to the bifurcation into an additional marginal vessel.  The right coronary artery was moderate-sized vessel that had irregularity proximally of 40% extending to the mid segment of 30-40% areas of luminal irregularity.  The RCA entered into the PDA and small posterolateral system.  Following successful percutaneous coronary intervention through the mid AV groove circumflex, the 100%/99% stenosis was ultimately stented with a 2.5 x 12 mm PROMUS Element DES stent placed up to the bifurcation but not generally in either branch and then into the AV groove circumflex with the stenosis being reduced to 0% and with TIMI-3 flow to several branches beyond this.  Biplane cine left ventriculography revealed normal LV global function. On the RAO projection, wall motion abnormality was not visualized.  On the LAO projection, there was mild mid posterolateral hypocontractility.  IMPRESSION: 1. Acute ST-segment elevation myocardial infarction inferolaterally     secondary to total occlusion of the mid atrioventricular groove     circumflex coronary artery. 2. A 30-40% proximal left anterior descending stenosis, 40-50% mid     left anterior descending stenosis. 3. Diffuse luminal irregularities of 30-40% in the proximal to mid     right coronary artery. 4. Profound bradycardia with heart rates dropping into the 20s and 30s     with periods of asystole responding to intravenous atropine     requiring temporary  pacemaker insertion. 5. Successful percutaneous coronary intervention to the circumflex     vessel with ultimate insertion of a 2.5 x 12 mm PROMUS Element DES     stent postdilated to 2.75 mm. 6. Bivalirudin/180 mg of oral Brilinta/intracoronary nitroglycerin.          ______________________________ Nicki Guadalajara, M.D.     TK/MEDQ  D:  04/05/2011  T:  04/06/2011  Job:  [id number]

## 2011-04-06 NOTE — Progress Notes (Signed)
CARDIAC REHAB PHASE I   PRE:  Rate/Rhythm: 66 SR  BP:  Supine:   Sitting: 121/63  Standing:    SaO2: 94 RA  MODE:  Ambulation: 250 ft   POST:  Rate/Rhythem: 76 SR  BP:  Supine:   Sitting: 128/61  Standing:    SaO2: 94 RA 1420=1510 Assisted X 1 to ambulate. Gait steady. VS stable. To recliner after walk with call light in reach.Started MI education with pt and wife. Beatrix Fetters

## 2011-04-06 NOTE — Progress Notes (Signed)
CRITICAL VALUE ALERT  Critical value received:  CKMB=48.2 ; Troponin 1  Date of notification: 04/06/11  Time of notification:  01:10  Critical value read back:yes Nurse who received alert:  Marisue Humble MD notified (1st page):  Armanda Magic  Time of first page: 01:58 MD notified (2nd page):  Time of second page:  Responding MD:  Armanda Magic   Time MD responded:  02:00

## 2011-04-06 NOTE — Progress Notes (Signed)
RFA/V sheaths removed without complications at 0930. Pressure to site x 30 min. Site level zero. Dressing applied. Pt teaching done. Rt pt/dp pulses palpable. Bedrest begins at 10am.

## 2011-04-06 NOTE — ED Provider Notes (Signed)
I saw and evaluated the patient, reviewed the resident's note and I agree with the findings and plan.  Gerard Cantara P Merci Walthers, MD 04/06/11 0935 

## 2011-04-06 NOTE — Progress Notes (Signed)
Subjective:  Admitted with acute inferior MI last night and DES to circ by Dr. Tresa Endo.  No chest pain this am and feels well.  Sheath still in and being removed at present. No SOB  Objective:  Vital Signs in the last 24 hours: BP 110/53  Pulse 52  Temp(Src) 98.3 F (36.8 C) (Oral)  Resp 16  Ht 5\' 8"  (1.727 m)  Wt 107.7 kg (237 lb 7 oz)  BMI 36.10 kg/m2  SpO2 97%  Physical Exam: Mild obese in NAD Lungs:  Clear  Cardiac:  Regular rhythm, normal S1 and S2, no S3, no rub Extremities: sheath present  Intake/Output from previous day: 12/11 0701 - 12/12 0700 In: 1070 [I.V.:1070] Out: 1600 [Urine:1600]  Lab Results: Basic Metabolic Panel:  Basename 04/06/11 0500 04/05/11 2358  NA 138 134*  K 4.7 4.6  CL 107 104  CO2 24 22  GLUCOSE 107* 96  BUN 16 18  CREATININE 0.76 0.72   CBC:  Basename 04/06/11 0500 04/05/11 2358  WBC 7.4 6.9  NEUTROABS -- 4.5  HGB 12.9* 12.4*  HCT 37.2* 36.9*  MCV 89.9 88.7  PLT 222 230   Cardiac Enzymes:  Basename 04/06/11 0500 04/05/11 2358 04/05/11 1945  CKTOTAL 783* 420* --  CKMB 93.8* 48.2* --  CKMBINDEX -- -- --  TROPONINI 20.69* 8.87* <0.30   EKG:  Resolution of ST elevation, no Q waves  Assessment/Plan:  1. Acute inferior MI stable post PCI 2. Hypertension 3. Hyperlipidemia   Remove sheath, Add beta blocker, advance activity when bedrest up.  Darden Palmer.  MD [facility] 04/06/2011, 9:18 AM

## 2011-04-06 NOTE — Plan of Care (Signed)
Problem: Phase II Progression Outcomes Goal: CV Risk Factors identified Outcome: Completed/Met Date Met:  04/06/11 Diet, lack of regular exercise.

## 2011-04-07 ENCOUNTER — Encounter (HOSPITAL_COMMUNITY): Payer: Self-pay | Admitting: Dietician

## 2011-04-07 LAB — CBC
MCHC: 33.6 g/dL (ref 30.0–36.0)
Platelets: 226 10*3/uL (ref 150–400)
RDW: 13.9 % (ref 11.5–15.5)
WBC: 9.8 10*3/uL (ref 4.0–10.5)

## 2011-04-07 MED ORDER — LISINOPRIL 20 MG PO TABS
20.0000 mg | ORAL_TABLET | Freq: Every day | ORAL | Status: DC
  Administered 2011-04-07 – 2011-04-08 (×2): 20 mg via ORAL
  Filled 2011-04-07 (×3): qty 1

## 2011-04-07 MED ORDER — ROSUVASTATIN CALCIUM 20 MG PO TABS
20.0000 mg | ORAL_TABLET | Freq: Every day | ORAL | Status: DC
  Administered 2011-04-07: 20 mg via ORAL
  Filled 2011-04-07 (×2): qty 1

## 2011-04-07 MED ORDER — ASPIRIN 81 MG PO CHEW
81.0000 mg | CHEWABLE_TABLET | Freq: Every day | ORAL | Status: DC
  Administered 2011-04-07: 81 mg via ORAL
  Filled 2011-04-07: qty 1

## 2011-04-07 NOTE — Progress Notes (Signed)
Pt smokes 1 ppd and is eager to quit. He is in action stage. He is planning to quit on his own. Discussed and mentioned med aid options should pt need it. Referred to 1-800 quit now for f/u and support. Discussed oral fixation substitutes, second hand smoke and in home smoking policy. Reviewed and gave pt Written education/contact information.

## 2011-04-07 NOTE — Progress Notes (Signed)
Called Dr. Donnie Aho about crestor order.  Shows "statin already ordered" however, discontinued in Epic upon transfer.  Dr. Donnie Aho will address tomorrow (04/08/11). Thomas Hoff

## 2011-04-07 NOTE — Progress Notes (Signed)
UR Completed.  George French Jane [phone] 04/07/2011  

## 2011-04-07 NOTE — Progress Notes (Signed)
Subjective:  Had 30 minutes chest discomfort last night,  None today and feels fine.  No SOB.    Objective:  Vital Signs in the last 24 hours: BP 126/63  Pulse 65  Temp(Src) 98 F (36.7 C) (Oral)  Resp 16  Ht 5\' 8"  (1.727 m)  Wt 107.7 kg (237 lb 7 oz)  BMI 36.10 kg/m2  SpO2 93%  Physical Exam: obese in NAD Lungs:  Clear  Cardiac:  Regular rhythm, normal S1 and S2, no S3, no rub Extremities: Cath site clean and dry, no hematoma  Intake/Output from previous day: 12/12 0701 - 12/13 0700 In: 2018 [P.O.:1560; I.V.:458] Out: 3650 [Urine:3650]  Lab Results: Basic Metabolic Panel:  Basename 04/06/11 0500 04/05/11 2358  NA 138 134*  K 4.7 4.6  CL 107 104  CO2 24 22  GLUCOSE 107* 96  BUN 16 18  CREATININE 0.76 0.72   CBC:  Basename 04/07/11 0605 04/06/11 0500 04/05/11 2358  WBC 9.8 7.4 --  NEUTROABS -- -- 4.5  HGB 13.6 12.9* --  HCT 40.5 37.2* --  MCV 90.6 89.9 --  PLT 226 222 --   Cardiac Enzymes:  Basename 04/06/11 0500 04/05/11 2358 04/05/11 1945  CKTOTAL 783* 420* --  CKMB 93.8* 48.2* --  CKMBINDEX -- -- --  TROPONINI 20.69* 8.87* <0.30   EKG:  Sinus rhythm.  Inferior T wave inversion, no Q waves  Assessment/Plan:  1. Acute inferior MI stable post PCI 2. Hypertension 3. Hyperlipidemia   Move to floor, add ACE,  Cardiac rehab.  IF stable in am d/c.  Darden Palmer.  MD [facility] 04/07/2011, 8:36 AM

## 2011-04-07 NOTE — Progress Notes (Signed)
CARDIAC REHAB PHASE I   PRE:  Rate/Rhythm: 67 Sr    BP: standing 157/77    SaO2:   MODE:  Ambulation: 350 ft   POST:  Rate/Rhythm: 87    BP: sitting 149/69     SaO2:   Tolerated well, no CP. Ed completed with pt and wife. Requests referral be sent to [place] [facility]. [phone]  Harriet Masson CES, ACSM

## 2011-04-08 MED ORDER — ROSUVASTATIN CALCIUM 20 MG PO TABS: 20.0000 mg | ORAL_TABLET | Freq: Every day | ORAL | Status: DC

## 2011-04-08 MED ORDER — TICAGRELOR 90 MG PO TABS
90.0000 mg | ORAL_TABLET | Freq: Two times a day (BID) | ORAL | Status: DC
  Filled 2011-04-08 (×2): qty 1

## 2011-04-08 MED ORDER — TICAGRELOR 90 MG PO TABS: 90.0000 mg | ORAL_TABLET | Freq: Two times a day (BID) | ORAL | Status: DC

## 2011-04-08 MED ORDER — METOPROLOL TARTRATE 25 MG PO TABS: 25.0000 mg | ORAL_TABLET | Freq: Two times a day (BID) | ORAL | Status: AC

## 2011-04-08 MED ORDER — NITROGLYCERIN 0.4 MG SL SUBL: 0.4000 mg | SUBLINGUAL_TABLET | SUBLINGUAL | Status: AC | PRN

## 2011-04-08 MED ORDER — ASPIRIN 81 MG PO CHEW: 81.0000 mg | CHEWABLE_TABLET | Freq: Every day | ORAL | Status: DC

## 2011-04-08 NOTE — Discharge Summary (Signed)
Physician Discharge Summary  Patient ID: George French MRN: [id number] DOB/AGE: [birth year] 70 y.o.  Admit date: 04/05/2011 Discharge date: 04/08/2011  Primary Discharge Diagnosis: 1. Acute myocardial infarction-initial episode  Secondary Discharge Diagnosis: 2. Coronary artery disease with occlusion of the distal circumflex treated with a drug-eluting stent this admission 3. Hypertension 4. Hyperlipidemia 5. Obesity 6. History of prostate cancer 7. Remote history of pericarditis  Significant Diagnostic Studies:  Emergency cardiac catheterization with percutaneous intervention of the distal circumflex with placement of a drug-eluting stent by Dr. Ozella Rocks Course: This 70 year old male has a history of hypertension and hyperlipidemia as well as tobacco abuse. He has a known history of pericarditis with a pericardial window placed in 2004 for Not. He developed some substernal chest discomfort the morning of admission and presented to the emergency room with prolonged substernal chest discomfort that started an hour prior to admission. EMS was called and he was found to have an acute ST elevation microinfarction was brought to [facility] for further evaluation please see the history and physical for remainder of the details.  The patient was taken to the catheterization laboratory is found to have mild/moderate disease involving the LAD and the right coronary artery. The circumflex was codominant and was occluded after a large marginal branch. He had placement of a 2.5 x 12 mm Promus drug-eluting stent which was post dilated to 2.75 mm. He had resolution of his chest pain and left ventriculogram did not show significant hypokinesis. Troponin peaked at 20 and CPK-MB peaked at 93.8.  He was taken to the CCU and was stable following the procedure. He will brief episode of chest discomfort the night prior to transfer to the floor. He was transferred to the floor and was seen by  cardiac rehabilitation. He was also seen by smoking cessation counseling advised to stop smoking. He was initiated on treatment with beta blockers and his lisinopril was restarted. He was in [employer] without further discomfort and is discharged at this time in improved condition. He was started on Ticagrelor 90 mg twice daily following the procedure but had a dose submitted the night prior to discharge. He was given a dose prior to discharge and was observed. He is to followup in the office in about 10 days.   Discharge Exam: Blood pressure 93/48, pulse 50, temperature 97.7 F (36.5 C), temperature source Oral, resp. rate 18, height 5\' 8"  (1.727 m), weight 107.7 kg (237 lb 7 oz), SpO2 93.00%.    Lungs clear. Cardiac exam normal S1-S2 no S3. Cath site clean and dry  Labs: CBC:   Lab Results  Component Value Date   WBC 9.8 04/07/2011   HGB 13.6 04/07/2011   HCT 40.5 04/07/2011   MCV 90.6 04/07/2011   PLT 226 04/07/2011   CMP:  Lab 04/06/11 0500 04/05/11 2358  NA 138 --  K 4.7 --  CL 107 --  CO2 24 --  BUN 16 --  CREATININE 0.76 --  CALCIUM 7.3* --  PROT -- 6.0  BILITOT -- 0.3  ALKPHOS -- 59  ALT -- 13  AST -- 32  GLUCOSE 107* --    Cardiac Enzymes:  Basename 04/06/11 0500 04/05/11 2358 04/05/11 1945  CKTOTAL 783* 420* --  CKMB 93.8* 48.2* --  CKMBINDEX -- -- --  TROPONINI 20.69* 8.87* <0.30    EKG: Followup EKG shows mild T wave inversions in the inferior leads but no Q waves.  Discharge Medications:  Nichael, Ehly  Home  Medication Instructions ONG:[id number]   Printed on:04/08/11 1349  Medication Information                    rosuvastatin (CRESTOR) 20 MG tablet Take 20 mg by mouth every other day.             lisinopril (PRINIVIL,ZESTRIL) 20 MG tablet Take 20 mg by mouth daily.             Multiple Vitamin (MULTIVITAMIN) capsule Take 1 capsule by mouth daily.             Calcium Carbonate-Vitamin D (CALCIUM-VITAMIN D) 500-200 MG-UNIT per tablet Take 1  tablet by mouth 2 (two) times daily with a meal.             aspirin 81 MG chewable tablet Chew 1 tablet (81 mg total) by mouth daily.           metoprolol tartrate (LOPRESSOR) 25 MG tablet Take 1 tablet (25 mg total) by mouth 2 (two) times daily.           nitroGLYCERIN (NITROSTAT) 0.4 MG SL tablet Place 1 tablet (0.4 mg total) under the tongue every 5 (five) minutes as needed for chest pain.           Ticagrelor (BRILINTA) 90 MG TABS tablet Take 1 tablet (90 mg total) by mouth 2 (two) times daily.             Followup plans and appointments:  He is to gradually resume activity per cardiac rehabilitation. Referral to cardiac rehabilitation phase II. Followup with Dr. Donnie Aho in 10 days. Report recurrent episodes of chest discomfort. Smoking cessation instructions given. He is to follow a heart healthy diet and to lose weight.   Signed: Darden Palmer. MD [facility] 04/08/2011, 1:49 PM

## 2011-04-08 NOTE — Plan of Care (Signed)
Problem: Phase III Progression Outcomes Goal: VS Stable with increased activity Outcome: Adequate for Discharge Ambulated to  desk Goal: ACE I or ARB if EF < 40% Outcome: Completed/Met Date Met:  04/08/11 Ef  56

## 2011-04-11 NOTE — Progress Notes (Signed)
Utilization review completed. Sakib Noguez, RN, BSN. 04/11/11 

## 2011-05-19 ENCOUNTER — Encounter (HOSPITAL_COMMUNITY)
Admission: RE | Admit: 2011-05-19 | Discharge: 2011-05-19 | Disposition: A | Discharge status: Home / Self Care | Payer: BC Managed Care – PPO | Source: Ambulatory Visit | Attending: Cardiology | Admitting: Cardiology

## 2011-05-19 ENCOUNTER — Encounter (HOSPITAL_COMMUNITY): Payer: Self-pay

## 2011-05-19 DIAGNOSIS — I252 Old myocardial infarction: Secondary | ICD-10-CM | POA: Insufficient documentation

## 2011-05-19 DIAGNOSIS — Z79899 Other long term (current) drug therapy: Secondary | ICD-10-CM | POA: Insufficient documentation

## 2011-05-19 DIAGNOSIS — E669 Obesity, unspecified: Secondary | ICD-10-CM | POA: Insufficient documentation

## 2011-05-19 DIAGNOSIS — Z7982 Long term (current) use of aspirin: Secondary | ICD-10-CM | POA: Insufficient documentation

## 2011-05-19 DIAGNOSIS — E785 Hyperlipidemia, unspecified: Secondary | ICD-10-CM | POA: Insufficient documentation

## 2011-05-19 DIAGNOSIS — I498 Other specified cardiac arrhythmias: Secondary | ICD-10-CM | POA: Insufficient documentation

## 2011-05-19 DIAGNOSIS — I2582 Chronic total occlusion of coronary artery: Secondary | ICD-10-CM | POA: Insufficient documentation

## 2011-05-19 DIAGNOSIS — F17200 Nicotine dependence, unspecified, uncomplicated: Secondary | ICD-10-CM | POA: Insufficient documentation

## 2011-05-19 DIAGNOSIS — Z8546 Personal history of malignant neoplasm of prostate: Secondary | ICD-10-CM | POA: Insufficient documentation

## 2011-05-19 DIAGNOSIS — I2510 Atherosclerotic heart disease of native coronary artery without angina pectoris: Secondary | ICD-10-CM | POA: Insufficient documentation

## 2011-05-19 DIAGNOSIS — I10 Essential (primary) hypertension: Secondary | ICD-10-CM | POA: Insufficient documentation

## 2011-05-19 DIAGNOSIS — Z9861 Coronary angioplasty status: Secondary | ICD-10-CM | POA: Insufficient documentation

## 2011-05-19 DIAGNOSIS — Z5189 Encounter for other specified aftercare: Secondary | ICD-10-CM | POA: Insufficient documentation

## 2011-05-23 ENCOUNTER — Encounter (HOSPITAL_COMMUNITY)
Admission: RE | Admit: 2011-05-23 | Discharge: 2011-05-23 | Disposition: A | Discharge status: Home / Self Care | Payer: BC Managed Care – PPO | Source: Ambulatory Visit | Attending: Cardiology | Admitting: Cardiology

## 2011-05-23 NOTE — Progress Notes (Signed)
Pt started cardiac rehab today.  Pt tolerated light exercise without difficulty.  VSS.  Telemetry-NSR, first degree AVB.  Pt denies cp or dyspnea with exercise.  Pt oriented to exercise equipment and routine.  Understanding verbalized-jr,rn

## 2011-05-25 ENCOUNTER — Encounter (HOSPITAL_COMMUNITY)
Admission: RE | Admit: 2011-05-25 | Discharge: 2011-05-25 | Disposition: A | Discharge status: Home / Self Care | Payer: BC Managed Care – PPO | Source: Ambulatory Visit | Attending: Cardiology | Admitting: Cardiology

## 2011-05-27 ENCOUNTER — Encounter (HOSPITAL_COMMUNITY)
Admission: RE | Admit: 2011-05-27 | Discharge: 2011-05-27 | Disposition: A | Discharge status: Home / Self Care | Payer: BC Managed Care – PPO | Source: Ambulatory Visit | Attending: Cardiology | Admitting: Cardiology

## 2011-05-27 DIAGNOSIS — I498 Other specified cardiac arrhythmias: Secondary | ICD-10-CM | POA: Insufficient documentation

## 2011-05-27 DIAGNOSIS — Z5189 Encounter for other specified aftercare: Secondary | ICD-10-CM | POA: Insufficient documentation

## 2011-05-27 DIAGNOSIS — E785 Hyperlipidemia, unspecified: Secondary | ICD-10-CM | POA: Insufficient documentation

## 2011-05-27 DIAGNOSIS — I2510 Atherosclerotic heart disease of native coronary artery without angina pectoris: Secondary | ICD-10-CM | POA: Insufficient documentation

## 2011-05-27 DIAGNOSIS — I10 Essential (primary) hypertension: Secondary | ICD-10-CM | POA: Insufficient documentation

## 2011-05-27 DIAGNOSIS — I2582 Chronic total occlusion of coronary artery: Secondary | ICD-10-CM | POA: Insufficient documentation

## 2011-05-27 DIAGNOSIS — Z7982 Long term (current) use of aspirin: Secondary | ICD-10-CM | POA: Insufficient documentation

## 2011-05-27 DIAGNOSIS — Z79899 Other long term (current) drug therapy: Secondary | ICD-10-CM | POA: Insufficient documentation

## 2011-05-27 DIAGNOSIS — Z8546 Personal history of malignant neoplasm of prostate: Secondary | ICD-10-CM | POA: Insufficient documentation

## 2011-05-27 DIAGNOSIS — F17200 Nicotine dependence, unspecified, uncomplicated: Secondary | ICD-10-CM | POA: Insufficient documentation

## 2011-05-27 DIAGNOSIS — Z9861 Coronary angioplasty status: Secondary | ICD-10-CM | POA: Insufficient documentation

## 2011-05-27 DIAGNOSIS — I252 Old myocardial infarction: Secondary | ICD-10-CM | POA: Insufficient documentation

## 2011-05-27 DIAGNOSIS — E669 Obesity, unspecified: Secondary | ICD-10-CM | POA: Insufficient documentation

## 2011-05-27 NOTE — Progress Notes (Signed)
George French 71 y.o. male       Nutrition Screen                                                                    YES  NO Do you live in a nursing home?  X   Do you eat out more than 3 times/week?    X If yes, how many times per week do you eat out?  Do you have food allergies?   X If yes, what are you allergic to?  Have you gained or lost more than 10 lbs without trying?               X If yes, how much weight have you lost and over what time period?  lbs gained or lost over  weeks/month  Do you want to lose weight?    X  If yes, what is a goal weight or amount of weight you would like to lose? 190#  Do you eat alone most of the time?   X   Do you eat less than 2 meals/day?  X If yes, how many meals do you eat?  Do you drink more than 3 alcohol drinks/day?  X If yes, how many drinks per day?  Are you having trouble with constipation? *  X If yes, what are you doing to help relieve constipation?  Do you have financial difficulties with buying food?*    X   Are you experiencing regular nausea/ vomiting?*     X   Do you have a poor appetite? *                                        X   Do you have trouble chewing/swallowing? *   X    Pt with diagnoses of:  X Dyslipidemia  / HDL< 40 / LDL>70 / High TG      X %  Body fat >goal / Body Mass Index >25 X HTN / BP >120/80 X MI XA1c >6 / CBG >126       Pt Risk Score   1       Diagnosis Risk Score  70       Total Risk Score   71                        X High Risk                Low Risk              HT: 68.5" Ht Readings from Last 1 Encounters:  05/19/11 5' 8.5" (1.74 m)    WT:   235.2 lb (106.9 kg) Wt Readings from Last 3 Encounters:  05/19/11 235 lb 10.8 oz (106.9 kg)  04/05/11 237 lb 7 oz (107.7 kg)  04/05/11 237 lb 7 oz (107.7 kg)     IBW 71.4 150%IBW BMI 35.3 32.8%body fat   Meds reviewed: MVI, Calcium with vitamin D  Past Medical History  Diagnosis Date  . Hypertension   . 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  Prostate cancer     treated with surgery and radiation  . Shortness of breath   . Arthritis   . 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 Obesity (BMI 30-39.9)   . History of atrial fibrillation 11/04/2002  . Hyperlipemia         Activity level: Pt is sedentary  Wt goal: 211-223 lb ( 95.9-101.4 kg) Current tobacco use? No     Pt quit tobacco use on 04/05/11 Food/Drug Interaction? No      Labs:  Lipid Panel  No results found for this basename: chol, trig, hdl, cholhdl, vldl, ldlcalc   Lab Results  Component Value Date   HGBA1C 6.3* 04/05/2011   04/06/11 Glucose 107   LDL goal:< 70       MI  and > 2:  HTN, family h/o, > 70 yo male  Estimated Daily Nutrition Needs for: ? wt loss  1550-2050 Kcal , Total Fat 40-55gm, Saturated Fat 11-16 gm, Trans Fat 1.5-2.0 gm,  Sodium less than 1500 mg

## 2011-05-30 ENCOUNTER — Encounter (HOSPITAL_COMMUNITY)
Admission: RE | Admit: 2011-05-30 | Discharge: 2011-05-30 | Disposition: A | Discharge status: Home / Self Care | Payer: BC Managed Care – PPO | Source: Ambulatory Visit | Attending: Cardiology | Admitting: Cardiology

## 2011-06-01 ENCOUNTER — Encounter (HOSPITAL_COMMUNITY)
Admission: RE | Admit: 2011-06-01 | Discharge: 2011-06-01 | Disposition: A | Discharge status: Home / Self Care | Payer: BC Managed Care – PPO | Source: Ambulatory Visit | Attending: Cardiology | Admitting: Cardiology

## 2011-06-03 ENCOUNTER — Encounter (HOSPITAL_COMMUNITY)
Admission: RE | Admit: 2011-06-03 | Discharge: 2011-06-03 | Disposition: A | Discharge status: Home / Self Care | Payer: BC Managed Care – PPO | Source: Ambulatory Visit | Attending: Cardiology | Admitting: Cardiology

## 2011-06-03 NOTE — Progress Notes (Signed)
Reviewed home exercise with pt today.  Pt plans to walk and use treadmill at home for exercise.  Reviewed THR, pulse, RPE, sign and symptoms, and when to call 911 or MD.  Pt voiced understanding. Electronically signed by Harriett Sine MS on Friday 06/03/2011 at 0911

## 2011-06-06 ENCOUNTER — Encounter (HOSPITAL_COMMUNITY)
Admission: RE | Admit: 2011-06-06 | Discharge: 2011-06-06 | Disposition: A | Discharge status: Home / Self Care | Payer: BC Managed Care – PPO | Source: Ambulatory Visit | Attending: Cardiology | Admitting: Cardiology

## 2011-06-08 ENCOUNTER — Encounter (HOSPITAL_COMMUNITY)
Admission: RE | Admit: 2011-06-08 | Discharge: 2011-06-08 | Disposition: A | Discharge status: Home / Self Care | Payer: BC Managed Care – PPO | Source: Ambulatory Visit | Attending: Cardiology | Admitting: Cardiology

## 2011-06-10 ENCOUNTER — Encounter (HOSPITAL_COMMUNITY)
Admission: RE | Admit: 2011-06-10 | Discharge: 2011-06-10 | Disposition: A | Discharge status: Home / Self Care | Payer: BC Managed Care – PPO | Source: Ambulatory Visit | Attending: Cardiology | Admitting: Cardiology

## 2011-06-13 ENCOUNTER — Encounter (HOSPITAL_COMMUNITY)
Admission: RE | Admit: 2011-06-13 | Discharge: 2011-06-13 | Disposition: A | Discharge status: Home / Self Care | Payer: BC Managed Care – PPO | Source: Ambulatory Visit | Attending: Cardiology | Admitting: Cardiology

## 2011-06-15 ENCOUNTER — Encounter (HOSPITAL_COMMUNITY)
Admission: RE | Admit: 2011-06-15 | Discharge: 2011-06-15 | Disposition: A | Discharge status: Home / Self Care | Payer: BC Managed Care – PPO | Source: Ambulatory Visit | Attending: Cardiology | Admitting: Cardiology

## 2011-06-15 NOTE — Progress Notes (Signed)
George French 71 y.o. male Nutrition Note  Spoke with pt.  Nutrition Plan and Nutrition Survey reviewed with pt. Pt is following Step 1 of the Therapeutic Lifestyle Changes diet. Pt wants to lose wt. Weight loss tips discussed.  Pt is pre-diabetic.  Pt states he has been pre-diabetic for "10-15 years."  Pt checks his CBG's occasionally at home.  Pt follows the basic diabetic diet principles. Pt reports he does not eat enough fruit.  Per 24 hour nutrition survey, pt consumed 2-3 servings of fruits and vegetables. Nutrition Diagnosis   Food-and nutrition-related knowledge deficit related to lack of exposure to information as related to diagnosis of: ? CVD ?  Pre-DM (A1c 6.3)    Obesity related to excessive energy intake as evidenced by a BMI of 35.3  Nutrition RX/ Estimated Daily Nutrition Needs for: wt loss  1550-2050 Kcal, 40-55 gm fat, 11-16 gm sat fat, 1.5-2.0 gm trans-fat, <1500 mg sodium  Nutrition Intervention   Pt's individual nutrition plan including cholesterol goals reviewed with pt.   Benefits of adopting Therapeutic Lifestyle Changes discussed when Medficts reviewed.   Pt to attend the Portion Distortion class   Pt to attend the  ? Nutrition I class - met, 05/31/11                    ? Nutrition II class - met, 05/24/11   Pt given handouts for: ? wt loss ? pre-DM    Continue client-centered nutrition education by RD, as part of interdisciplinary care. Goal(s)   Pt to identify food quantities necessary to achieve: ? wt loss to a goal wt of 211-223 lb (95.9-101.4 kg) at graduation from cardiac rehab.    Pt to describe the benefit of including fruits, vegetables, whole grains, and low-fat dairy products in a heart healthy meal plan. Monitor and Evaluate progress toward nutrition goal with team.  Nutrition Risk: Low

## 2011-06-17 ENCOUNTER — Encounter (HOSPITAL_COMMUNITY)
Admission: RE | Admit: 2011-06-17 | Discharge: 2011-06-17 | Disposition: A | Discharge status: Home / Self Care | Payer: BC Managed Care – PPO | Source: Ambulatory Visit | Attending: Cardiology | Admitting: Cardiology

## 2011-06-20 ENCOUNTER — Encounter (HOSPITAL_COMMUNITY)
Admission: RE | Admit: 2011-06-20 | Discharge: 2011-06-20 | Disposition: A | Discharge status: Home / Self Care | Payer: BC Managed Care – PPO | Source: Ambulatory Visit | Attending: Cardiology | Admitting: Cardiology

## 2011-06-22 ENCOUNTER — Encounter (HOSPITAL_COMMUNITY)
Admission: RE | Admit: 2011-06-22 | Discharge: 2011-06-22 | Disposition: A | Discharge status: Home / Self Care | Payer: BC Managed Care – PPO | Source: Ambulatory Visit | Attending: Cardiology | Admitting: Cardiology

## 2011-06-24 ENCOUNTER — Encounter (HOSPITAL_COMMUNITY)
Admission: RE | Admit: 2011-06-24 | Discharge: 2011-06-24 | Disposition: A | Discharge status: Home / Self Care | Payer: BC Managed Care – PPO | Source: Ambulatory Visit | Attending: Cardiology | Admitting: Cardiology

## 2011-06-24 DIAGNOSIS — Z7982 Long term (current) use of aspirin: Secondary | ICD-10-CM | POA: Insufficient documentation

## 2011-06-24 DIAGNOSIS — I10 Essential (primary) hypertension: Secondary | ICD-10-CM | POA: Insufficient documentation

## 2011-06-24 DIAGNOSIS — Z8546 Personal history of malignant neoplasm of prostate: Secondary | ICD-10-CM | POA: Insufficient documentation

## 2011-06-24 DIAGNOSIS — Z5189 Encounter for other specified aftercare: Secondary | ICD-10-CM | POA: Insufficient documentation

## 2011-06-24 DIAGNOSIS — E669 Obesity, unspecified: Secondary | ICD-10-CM | POA: Insufficient documentation

## 2011-06-24 DIAGNOSIS — Z9861 Coronary angioplasty status: Secondary | ICD-10-CM | POA: Insufficient documentation

## 2011-06-24 DIAGNOSIS — I2510 Atherosclerotic heart disease of native coronary artery without angina pectoris: Secondary | ICD-10-CM | POA: Insufficient documentation

## 2011-06-24 DIAGNOSIS — F17200 Nicotine dependence, unspecified, uncomplicated: Secondary | ICD-10-CM | POA: Insufficient documentation

## 2011-06-24 DIAGNOSIS — I2582 Chronic total occlusion of coronary artery: Secondary | ICD-10-CM | POA: Insufficient documentation

## 2011-06-24 DIAGNOSIS — E785 Hyperlipidemia, unspecified: Secondary | ICD-10-CM | POA: Insufficient documentation

## 2011-06-24 DIAGNOSIS — Z79899 Other long term (current) drug therapy: Secondary | ICD-10-CM | POA: Insufficient documentation

## 2011-06-24 DIAGNOSIS — I252 Old myocardial infarction: Secondary | ICD-10-CM | POA: Insufficient documentation

## 2011-06-24 DIAGNOSIS — I498 Other specified cardiac arrhythmias: Secondary | ICD-10-CM | POA: Insufficient documentation

## 2011-06-25 ENCOUNTER — Other Ambulatory Visit: Payer: Self-pay | Admitting: Cardiology

## 2011-06-27 ENCOUNTER — Encounter (HOSPITAL_COMMUNITY)
Admission: RE | Admit: 2011-06-27 | Discharge: 2011-06-27 | Disposition: A | Discharge status: Home / Self Care | Payer: BC Managed Care – PPO | Source: Ambulatory Visit | Attending: Cardiology | Admitting: Cardiology

## 2011-06-29 ENCOUNTER — Encounter (HOSPITAL_COMMUNITY): Payer: BC Managed Care – PPO

## 2011-07-01 ENCOUNTER — Encounter (HOSPITAL_COMMUNITY): Payer: BC Managed Care – PPO

## 2011-07-04 ENCOUNTER — Encounter (HOSPITAL_COMMUNITY)
Admission: RE | Admit: 2011-07-04 | Discharge: 2011-07-04 | Disposition: A | Discharge status: Home / Self Care | Payer: BC Managed Care – PPO | Source: Ambulatory Visit | Attending: Cardiology | Admitting: Cardiology

## 2011-07-06 ENCOUNTER — Encounter (HOSPITAL_COMMUNITY)
Admission: RE | Admit: 2011-07-06 | Discharge: 2011-07-06 | Disposition: A | Discharge status: Home / Self Care | Payer: BC Managed Care – PPO | Source: Ambulatory Visit | Attending: Cardiology | Admitting: Cardiology

## 2011-07-08 ENCOUNTER — Encounter (HOSPITAL_COMMUNITY)
Admission: RE | Admit: 2011-07-08 | Discharge: 2011-07-08 | Disposition: A | Discharge status: Home / Self Care | Payer: BC Managed Care – PPO | Source: Ambulatory Visit | Attending: Cardiology | Admitting: Cardiology

## 2011-07-11 ENCOUNTER — Encounter (HOSPITAL_COMMUNITY)
Admission: RE | Admit: 2011-07-11 | Discharge: 2011-07-11 | Disposition: A | Discharge status: Home / Self Care | Payer: BC Managed Care – PPO | Source: Ambulatory Visit | Attending: Cardiology | Admitting: Cardiology

## 2011-07-11 NOTE — Progress Notes (Signed)
Quality of Life Index reviewed with pt.  Pt states he is moderately dissatisfied with overall health with concerns re:  Prostate CA.  Pt has 6 month f/u in April to address need for further treatments.  Pt reports his energy is improving with cardiac rehab, however dyspnea remains the same.  Pt reports this is tolerable for him.  Overall pt has a positive outlook and denies sx of depression.

## 2011-07-13 ENCOUNTER — Encounter (HOSPITAL_COMMUNITY)
Admission: RE | Admit: 2011-07-13 | Discharge: 2011-07-13 | Disposition: A | Discharge status: Home / Self Care | Payer: BC Managed Care – PPO | Source: Ambulatory Visit | Attending: Cardiology | Admitting: Cardiology

## 2011-07-15 ENCOUNTER — Encounter (HOSPITAL_COMMUNITY)
Admission: RE | Admit: 2011-07-15 | Discharge: 2011-07-15 | Disposition: A | Discharge status: Home / Self Care | Payer: BC Managed Care – PPO | Source: Ambulatory Visit | Attending: Cardiology | Admitting: Cardiology

## 2011-07-18 ENCOUNTER — Encounter (HOSPITAL_COMMUNITY)
Admission: RE | Admit: 2011-07-18 | Discharge: 2011-07-18 | Disposition: A | Discharge status: Home / Self Care | Payer: BC Managed Care – PPO | Source: Ambulatory Visit | Attending: Cardiology | Admitting: Cardiology

## 2011-07-20 ENCOUNTER — Encounter (HOSPITAL_COMMUNITY)
Admission: RE | Admit: 2011-07-20 | Discharge: 2011-07-20 | Disposition: A | Discharge status: Home / Self Care | Payer: BC Managed Care – PPO | Source: Ambulatory Visit | Attending: Cardiology | Admitting: Cardiology

## 2011-07-22 ENCOUNTER — Encounter (HOSPITAL_COMMUNITY)
Admission: RE | Admit: 2011-07-22 | Discharge: 2011-07-22 | Disposition: A | Discharge status: Home / Self Care | Payer: BC Managed Care – PPO | Source: Ambulatory Visit | Attending: Cardiology | Admitting: Cardiology

## 2011-07-25 ENCOUNTER — Encounter (HOSPITAL_COMMUNITY)
Admission: RE | Admit: 2011-07-25 | Discharge: 2011-07-25 | Disposition: A | Discharge status: Home / Self Care | Payer: BC Managed Care – PPO | Source: Ambulatory Visit | Attending: Cardiology | Admitting: Cardiology

## 2011-07-25 DIAGNOSIS — Z5189 Encounter for other specified aftercare: Secondary | ICD-10-CM | POA: Insufficient documentation

## 2011-07-25 DIAGNOSIS — E785 Hyperlipidemia, unspecified: Secondary | ICD-10-CM | POA: Insufficient documentation

## 2011-07-25 DIAGNOSIS — E669 Obesity, unspecified: Secondary | ICD-10-CM | POA: Insufficient documentation

## 2011-07-25 DIAGNOSIS — I2582 Chronic total occlusion of coronary artery: Secondary | ICD-10-CM | POA: Insufficient documentation

## 2011-07-25 DIAGNOSIS — Z8546 Personal history of malignant neoplasm of prostate: Secondary | ICD-10-CM | POA: Insufficient documentation

## 2011-07-25 DIAGNOSIS — I2510 Atherosclerotic heart disease of native coronary artery without angina pectoris: Secondary | ICD-10-CM | POA: Insufficient documentation

## 2011-07-25 DIAGNOSIS — Z9861 Coronary angioplasty status: Secondary | ICD-10-CM | POA: Insufficient documentation

## 2011-07-25 DIAGNOSIS — Z79899 Other long term (current) drug therapy: Secondary | ICD-10-CM | POA: Insufficient documentation

## 2011-07-25 DIAGNOSIS — I252 Old myocardial infarction: Secondary | ICD-10-CM | POA: Insufficient documentation

## 2011-07-25 DIAGNOSIS — F17200 Nicotine dependence, unspecified, uncomplicated: Secondary | ICD-10-CM | POA: Insufficient documentation

## 2011-07-25 DIAGNOSIS — I10 Essential (primary) hypertension: Secondary | ICD-10-CM | POA: Insufficient documentation

## 2011-07-25 DIAGNOSIS — I498 Other specified cardiac arrhythmias: Secondary | ICD-10-CM | POA: Insufficient documentation

## 2011-07-25 DIAGNOSIS — Z7982 Long term (current) use of aspirin: Secondary | ICD-10-CM | POA: Insufficient documentation

## 2011-07-27 ENCOUNTER — Encounter (HOSPITAL_COMMUNITY)
Admission: RE | Admit: 2011-07-27 | Discharge: 2011-07-27 | Disposition: A | Discharge status: Home / Self Care | Payer: BC Managed Care – PPO | Source: Ambulatory Visit | Attending: Cardiology | Admitting: Cardiology

## 2011-07-29 ENCOUNTER — Encounter (HOSPITAL_COMMUNITY)
Admission: RE | Admit: 2011-07-29 | Discharge: 2011-07-29 | Disposition: A | Discharge status: Home / Self Care | Payer: BC Managed Care – PPO | Source: Ambulatory Visit | Attending: Cardiology | Admitting: Cardiology

## 2011-08-01 ENCOUNTER — Encounter (HOSPITAL_COMMUNITY)
Admission: RE | Admit: 2011-08-01 | Discharge: 2011-08-01 | Disposition: A | Discharge status: Home / Self Care | Payer: BC Managed Care – PPO | Source: Ambulatory Visit | Attending: Cardiology | Admitting: Cardiology

## 2011-08-03 ENCOUNTER — Encounter (HOSPITAL_COMMUNITY)
Admission: RE | Admit: 2011-08-03 | Discharge: 2011-08-03 | Disposition: A | Discharge status: Home / Self Care | Payer: BC Managed Care – PPO | Source: Ambulatory Visit | Attending: Cardiology | Admitting: Cardiology

## 2011-08-05 ENCOUNTER — Encounter (HOSPITAL_COMMUNITY)
Admission: RE | Admit: 2011-08-05 | Discharge: 2011-08-05 | Disposition: A | Discharge status: Home / Self Care | Payer: BC Managed Care – PPO | Source: Ambulatory Visit | Attending: Cardiology | Admitting: Cardiology

## 2011-08-08 ENCOUNTER — Encounter (HOSPITAL_COMMUNITY)
Admission: RE | Admit: 2011-08-08 | Discharge: 2011-08-08 | Disposition: A | Discharge status: Home / Self Care | Payer: BC Managed Care – PPO | Source: Ambulatory Visit | Attending: Cardiology | Admitting: Cardiology

## 2011-08-10 ENCOUNTER — Encounter (HOSPITAL_COMMUNITY)
Admission: RE | Admit: 2011-08-10 | Discharge: 2011-08-10 | Disposition: A | Discharge status: Home / Self Care | Payer: BC Managed Care – PPO | Source: Ambulatory Visit | Attending: Cardiology | Admitting: Cardiology

## 2011-08-12 ENCOUNTER — Encounter (HOSPITAL_COMMUNITY)
Admission: RE | Admit: 2011-08-12 | Discharge: 2011-08-12 | Disposition: A | Discharge status: Home / Self Care | Payer: BC Managed Care – PPO | Source: Ambulatory Visit | Attending: Cardiology | Admitting: Cardiology

## 2011-08-15 ENCOUNTER — Other Ambulatory Visit (HOSPITAL_COMMUNITY): Payer: Self-pay | Admitting: Urology

## 2011-08-15 ENCOUNTER — Encounter (HOSPITAL_COMMUNITY)
Admission: RE | Admit: 2011-08-15 | Discharge: 2011-08-15 | Disposition: A | Discharge status: Home / Self Care | Payer: BC Managed Care – PPO | Source: Ambulatory Visit | Attending: Cardiology | Admitting: Cardiology

## 2011-08-15 DIAGNOSIS — C61 Malignant neoplasm of prostate: Secondary | ICD-10-CM

## 2011-08-17 ENCOUNTER — Encounter (HOSPITAL_COMMUNITY)
Admission: RE | Admit: 2011-08-17 | Discharge: 2011-08-17 | Disposition: A | Discharge status: Home / Self Care | Payer: BC Managed Care – PPO | Source: Ambulatory Visit | Attending: Cardiology | Admitting: Cardiology

## 2011-08-17 ENCOUNTER — Encounter (HOSPITAL_COMMUNITY): Payer: Self-pay

## 2011-08-19 ENCOUNTER — Encounter (HOSPITAL_COMMUNITY): Payer: BC Managed Care – PPO

## 2011-08-22 ENCOUNTER — Encounter (HOSPITAL_COMMUNITY): Payer: BC Managed Care – PPO

## 2011-08-24 ENCOUNTER — Encounter (HOSPITAL_COMMUNITY)
Admission: RE | Admit: 2011-08-24 | Discharge: 2011-08-24 | Disposition: A | Discharge status: Home / Self Care | Payer: BC Managed Care – PPO | Source: Ambulatory Visit | Attending: Urology | Admitting: Urology

## 2011-08-24 ENCOUNTER — Encounter (HOSPITAL_COMMUNITY): Payer: BC Managed Care – PPO

## 2011-08-24 DIAGNOSIS — C61 Malignant neoplasm of prostate: Secondary | ICD-10-CM

## 2011-08-24 DIAGNOSIS — M25519 Pain in unspecified shoulder: Secondary | ICD-10-CM | POA: Insufficient documentation

## 2011-08-24 MED ORDER — TECHNETIUM TC 99M MEDRONATE IV KIT
25.1000 | PACK | Freq: Once | INTRAVENOUS | Status: AC | PRN
  Administered 2011-08-24: 25.1 via INTRAVENOUS

## 2011-08-26 ENCOUNTER — Encounter (HOSPITAL_COMMUNITY): Payer: BC Managed Care – PPO

## 2011-08-26 NOTE — Progress Notes (Signed)
Addendum to Nutrition Section of Cardiac  Rehab Program Progress Report  Pt improved his diet from a step 1 Therapeutic Lifestyle Changes diet to step 2 Therapeutic Lifestyle Changes diet at discharge from Cardiac Rehab.

## 2011-10-05 NOTE — Progress Notes (Signed)
Pt graduated from phase II cardiac rehab after thirty-six sessions on 08/17/2011. Increased functional capacity 7.62%. Reduced bodyweight by 2.3 kg. Pt improved overall QOL score by 1.24%. Anticipated home exercise compliance: fair. Electronically signed by Harriett Sine MS on Wednesday October 05 2011 at 1110

## 2011-10-05 NOTE — Progress Notes (Signed)
Pt successfully completed cardiac rehab program.  Pt attended 36/36 exercise sessions and 15 education classes with excellent participation.  Pt plans to exercise on his own. Pt VSS, Telemetry-NSR.  Pt did not have hospital admission during cardiac rehab program.  Pt is a pleasure to work with.  Thank you for the referral.

## 2012-05-23 ENCOUNTER — Other Ambulatory Visit (HOSPITAL_COMMUNITY): Payer: Self-pay | Admitting: Urology

## 2012-05-23 DIAGNOSIS — C61 Malignant neoplasm of prostate: Secondary | ICD-10-CM

## 2012-05-24 ENCOUNTER — Ambulatory Visit (HOSPITAL_COMMUNITY)
Admission: RE | Admit: 2012-05-24 | Discharge: 2012-05-24 | Disposition: A | Discharge status: Home / Self Care | Payer: BC Managed Care – PPO | Source: Ambulatory Visit | Attending: Urology | Admitting: Urology

## 2012-05-24 ENCOUNTER — Other Ambulatory Visit (HOSPITAL_COMMUNITY): Payer: Self-pay | Admitting: Urology

## 2012-05-24 ENCOUNTER — Encounter (HOSPITAL_COMMUNITY)
Admission: RE | Admit: 2012-05-24 | Discharge: 2012-05-24 | Disposition: A | Discharge status: Home / Self Care | Payer: Medicare Other | Source: Ambulatory Visit | Attending: Urology | Admitting: Urology

## 2012-05-24 ENCOUNTER — Encounter (HOSPITAL_COMMUNITY)
Admission: RE | Admit: 2012-05-24 | Discharge: 2012-05-24 | Disposition: A | Discharge status: Home / Self Care | Payer: BC Managed Care – PPO | Source: Ambulatory Visit | Attending: Urology | Admitting: Urology

## 2012-05-24 DIAGNOSIS — I10 Essential (primary) hypertension: Secondary | ICD-10-CM | POA: Insufficient documentation

## 2012-05-24 DIAGNOSIS — C61 Malignant neoplasm of prostate: Secondary | ICD-10-CM | POA: Insufficient documentation

## 2012-05-24 DIAGNOSIS — F17200 Nicotine dependence, unspecified, uncomplicated: Secondary | ICD-10-CM | POA: Insufficient documentation

## 2012-05-24 MED ORDER — TECHNETIUM TC 99M MEDRONATE IV KIT
25.0000 | PACK | Freq: Once | INTRAVENOUS | Status: AC | PRN
  Administered 2012-05-24: 27.5 via INTRAVENOUS

## 2012-05-25 ENCOUNTER — Encounter: Payer: Medicare Other | Admitting: Cardiology

## 2012-05-25 DIAGNOSIS — Z006 Encounter for examination for normal comparison and control in clinical research program: Secondary | ICD-10-CM

## 2012-05-25 NOTE — Progress Notes (Signed)
[facility] EKG 

## 2012-06-01 ENCOUNTER — Ambulatory Visit (INDEPENDENT_AMBULATORY_CARE_PROVIDER_SITE_OTHER): Payer: Medicare Other | Admitting: Cardiology

## 2012-06-01 DIAGNOSIS — Z01810 Encounter for preprocedural cardiovascular examination: Secondary | ICD-10-CM

## 2012-06-01 DIAGNOSIS — R0989 Other specified symptoms and signs involving the circulatory and respiratory systems: Secondary | ICD-10-CM

## 2012-06-01 NOTE — Progress Notes (Signed)
[facility] EKG 

## 2012-06-01 NOTE — Progress Notes (Signed)
EKG only

## 2012-07-02 ENCOUNTER — Other Ambulatory Visit (HOSPITAL_COMMUNITY): Payer: Self-pay | Admitting: Urology

## 2012-07-02 DIAGNOSIS — C61 Malignant neoplasm of prostate: Secondary | ICD-10-CM

## 2012-08-22 ENCOUNTER — Encounter (HOSPITAL_COMMUNITY)
Admission: RE | Admit: 2012-08-22 | Discharge: 2012-08-22 | Disposition: A | Discharge status: Home / Self Care | Payer: Medicare Other | Source: Ambulatory Visit | Attending: Urology | Admitting: Urology

## 2012-08-22 ENCOUNTER — Encounter (HOSPITAL_COMMUNITY): Payer: Self-pay

## 2012-08-22 DIAGNOSIS — C61 Malignant neoplasm of prostate: Secondary | ICD-10-CM | POA: Insufficient documentation

## 2012-08-22 MED ORDER — TECHNETIUM TC 99M MEDRONATE IV KIT
25.0000 | PACK | Freq: Once | INTRAVENOUS | Status: AC | PRN
  Administered 2012-08-22: 25 via INTRAVENOUS

## 2012-08-27 ENCOUNTER — Other Ambulatory Visit (HOSPITAL_COMMUNITY): Payer: Self-pay | Admitting: Urology

## 2012-08-27 DIAGNOSIS — C61 Malignant neoplasm of prostate: Secondary | ICD-10-CM

## 2012-11-15 ENCOUNTER — Encounter (HOSPITAL_COMMUNITY): Payer: Self-pay

## 2012-11-15 ENCOUNTER — Encounter (HOSPITAL_COMMUNITY)
Admission: RE | Admit: 2012-11-15 | Discharge: 2012-11-15 | Disposition: A | Discharge status: Home / Self Care | Payer: Medicare Other | Source: Ambulatory Visit | Attending: Urology | Admitting: Urology

## 2012-11-15 DIAGNOSIS — C7951 Secondary malignant neoplasm of bone: Secondary | ICD-10-CM | POA: Insufficient documentation

## 2012-11-15 DIAGNOSIS — C61 Malignant neoplasm of prostate: Secondary | ICD-10-CM

## 2012-11-15 DIAGNOSIS — C7952 Secondary malignant neoplasm of bone marrow: Secondary | ICD-10-CM | POA: Insufficient documentation

## 2012-11-15 MED ORDER — TECHNETIUM TC 99M MEDRONATE IV KIT
26.0000 | PACK | Freq: Once | INTRAVENOUS | Status: AC | PRN
  Administered 2012-11-15: 26 via INTRAVENOUS

## 2012-11-28 ENCOUNTER — Other Ambulatory Visit (HOSPITAL_COMMUNITY): Payer: Self-pay | Admitting: Urology

## 2012-11-28 DIAGNOSIS — C61 Malignant neoplasm of prostate: Secondary | ICD-10-CM

## 2013-02-12 ENCOUNTER — Encounter (HOSPITAL_COMMUNITY)
Admission: RE | Admit: 2013-02-12 | Discharge: 2013-02-12 | Disposition: A | Discharge status: Home / Self Care | Payer: Medicare Other | Source: Ambulatory Visit | Attending: Urology | Admitting: Urology

## 2013-02-12 DIAGNOSIS — C61 Malignant neoplasm of prostate: Secondary | ICD-10-CM | POA: Insufficient documentation

## 2013-02-12 MED ORDER — TECHNETIUM TC 99M MEDRONATE IV KIT
25.0000 | PACK | Freq: Once | INTRAVENOUS | Status: AC | PRN
  Administered 2013-02-12: 25 via INTRAVENOUS

## 2013-02-18 ENCOUNTER — Other Ambulatory Visit (HOSPITAL_COMMUNITY): Payer: Self-pay | Admitting: Urology

## 2013-02-18 DIAGNOSIS — C61 Malignant neoplasm of prostate: Secondary | ICD-10-CM

## 2013-05-01 ENCOUNTER — Encounter (HOSPITAL_COMMUNITY)
Admission: RE | Admit: 2013-05-01 | Discharge: 2013-05-01 | Disposition: A | Discharge status: Home / Self Care | Payer: Medicare Other | Source: Ambulatory Visit | Attending: Urology | Admitting: Urology

## 2013-05-01 ENCOUNTER — Encounter (HOSPITAL_COMMUNITY): Payer: Self-pay

## 2013-05-01 DIAGNOSIS — C61 Malignant neoplasm of prostate: Secondary | ICD-10-CM

## 2013-05-01 DIAGNOSIS — R948 Abnormal results of function studies of other organs and systems: Secondary | ICD-10-CM | POA: Insufficient documentation

## 2013-05-01 DIAGNOSIS — C7952 Secondary malignant neoplasm of bone marrow: Secondary | ICD-10-CM

## 2013-05-01 DIAGNOSIS — C7951 Secondary malignant neoplasm of bone: Secondary | ICD-10-CM | POA: Insufficient documentation

## 2013-05-01 MED ORDER — TECHNETIUM TC 99M MEDRONATE IV KIT
25.4000 | PACK | Freq: Once | INTRAVENOUS | Status: AC | PRN
  Administered 2013-05-01: 25.4 via INTRAVENOUS

## 2013-05-09 ENCOUNTER — Other Ambulatory Visit (HOSPITAL_COMMUNITY): Payer: Self-pay | Admitting: Urology

## 2013-05-09 DIAGNOSIS — C61 Malignant neoplasm of prostate: Secondary | ICD-10-CM

## 2013-07-31 ENCOUNTER — Encounter (HOSPITAL_COMMUNITY)
Admission: RE | Admit: 2013-07-31 | Discharge: 2013-07-31 | Disposition: A | Discharge status: Home / Self Care | Payer: Medicare Other | Source: Ambulatory Visit | Attending: Urology | Admitting: Urology

## 2013-07-31 DIAGNOSIS — C61 Malignant neoplasm of prostate: Secondary | ICD-10-CM | POA: Insufficient documentation

## 2013-07-31 MED ORDER — TECHNETIUM TC 99M MEDRONATE IV KIT
27.5000 | PACK | Freq: Once | INTRAVENOUS | Status: AC | PRN
  Administered 2013-07-31: 27.5 via INTRAVENOUS

## 2014-01-23 HISTORY — PX: APPENDECTOMY: SHX54

## 2014-04-02 ENCOUNTER — Encounter (HOSPITAL_COMMUNITY): Payer: Self-pay | Admitting: Cardiovascular Disease

## 2014-06-23 ENCOUNTER — Other Ambulatory Visit (HOSPITAL_COMMUNITY): Payer: Self-pay | Admitting: Nurse Practitioner

## 2014-06-23 DIAGNOSIS — C419 Malignant neoplasm of bone and articular cartilage, unspecified: Secondary | ICD-10-CM

## 2014-06-23 DIAGNOSIS — M546 Pain in thoracic spine: Secondary | ICD-10-CM

## 2014-06-25 ENCOUNTER — Other Ambulatory Visit (HOSPITAL_COMMUNITY): Payer: Self-pay | Admitting: Nurse Practitioner

## 2014-06-25 DIAGNOSIS — C419 Malignant neoplasm of bone and articular cartilage, unspecified: Secondary | ICD-10-CM

## 2014-06-25 DIAGNOSIS — M546 Pain in thoracic spine: Secondary | ICD-10-CM

## 2014-06-25 DIAGNOSIS — C61 Malignant neoplasm of prostate: Secondary | ICD-10-CM

## 2014-07-03 ENCOUNTER — Ambulatory Visit (HOSPITAL_COMMUNITY)
Admission: RE | Admit: 2014-07-03 | Discharge: 2014-07-03 | Disposition: A | Discharge status: Home / Self Care | Payer: Medicare Other | Source: Ambulatory Visit | Attending: Nurse Practitioner | Admitting: Nurse Practitioner

## 2014-07-03 ENCOUNTER — Encounter (HOSPITAL_COMMUNITY)
Admission: RE | Admit: 2014-07-03 | Discharge: 2014-07-03 | Disposition: A | Discharge status: Home / Self Care | Payer: Medicare Other | Source: Ambulatory Visit | Attending: Nurse Practitioner | Admitting: Nurse Practitioner

## 2014-07-03 DIAGNOSIS — M546 Pain in thoracic spine: Secondary | ICD-10-CM | POA: Insufficient documentation

## 2014-07-03 DIAGNOSIS — C7951 Secondary malignant neoplasm of bone: Secondary | ICD-10-CM | POA: Insufficient documentation

## 2014-07-03 DIAGNOSIS — C61 Malignant neoplasm of prostate: Secondary | ICD-10-CM | POA: Diagnosis not present

## 2014-07-03 DIAGNOSIS — C419 Malignant neoplasm of bone and articular cartilage, unspecified: Secondary | ICD-10-CM

## 2014-07-03 MED ORDER — TECHNETIUM TC 99M MEDRONATE IV KIT: 26.5000 | PACK | Freq: Once | INTRAVENOUS | Status: AC | PRN

## 2014-12-10 HISTORY — PX: OTHER SURGICAL HISTORY: SHX169

## 2015-05-28 ENCOUNTER — Other Ambulatory Visit: Payer: Self-pay | Admitting: Urology

## 2015-06-02 NOTE — Patient Instructions (Addendum)
NIKKI JENSON  06/02/2015   Your procedure is scheduled on: 06-05-15  Report to [facility]  Entrance take [facility]  elevators to 3rd floor to  [place] at 1100 AM.  Call this number if you have problems the morning of surgery [phone]   Remember: ONLY 1 PERSON MAY GO WITH YOU TO SHORT STAY TO GET  READY MORNING OF [place].  Do not eat food :After Midnight, clear liquids midnight until 0700 am, nothing by mouth after 700 am day of surgery     Take these medicines the morning of surgery with A SIP OF WATER:  METOPROLOL TARTRATE                               You may not have any metal on your body including hair pins and              piercings  Do not wear jewelry, make-up, lotions, powders or perfumes, deodorant             Do not wear nail polish.  Do not shave  48 hours prior to surgery.              Men may shave face and neck.   Do not bring valuables to the hospital. [place].  Contacts, dentures or bridgework may not be worn into surgery.  Leave suitcase in the car. After surgery it may be brought to your room.     Patients discharged the day of surgery will not be allowed to drive home.  Name and phone number of your driver:  Special Instructions: N/A              Please read over the following fact sheets you were given: _____________________________________________________________________                CLEAR LIQUID DIET   Foods Allowed                                                                     Foods Excluded  Coffee and tea, regular and decaf                             liquids that you cannot  Plain Jell-O in any flavor                                             see through such as: Fruit ices (not with fruit pulp)                                     milk, soups, orange juice  Iced Popsicles  All solid food Carbonated  beverages, regular and diet                                    Cranberry, grape and apple juices Sports drinks like Gatorade Lightly seasoned clear broth or consume(fat free) Sugar, honey syrup  Sample Menu Breakfast                                Lunch                                     Supper Cranberry juice                    Beef broth                            Chicken broth Jell-O                                     Grape juice                           Apple juice Coffee or tea                        Jell-O                                      Popsicle                                                Coffee or tea                        Coffee or tea  _____________________________________________________________________  [facility] - Preparing for Surgery Before surgery, you can play an important role.  Because skin is not sterile, your skin needs to be as free of germs as possible.  You can reduce the number of germs on your skin by washing with CHG (chlorahexidine gluconate) soap before surgery.  CHG is an antiseptic cleaner which kills germs and bonds with the skin to continue killing germs even after washing. Please DO NOT use if you have an allergy to CHG or antibacterial soaps.  If your skin becomes reddened/irritated stop using the CHG and inform your nurse when you arrive at Short Stay. Do not shave (including legs and underarms) for at least 48 hours prior to the first CHG shower.  You may shave your face/neck. Please follow these instructions carefully:  1.  Shower with CHG Soap the night before surgery and the  morning of Surgery.  2.  If you choose to wash your hair, wash your hair first as usual with your  normal  shampoo.  3.  After you shampoo, rinse your hair and body thoroughly to remove the  shampoo.  4.  Use CHG as you would any other liquid soap.  You can apply chg directly  to the skin and wash                       Gently with a scrungie or  clean washcloth.  5.  Apply the CHG Soap to your body ONLY FROM THE NECK DOWN.   Do not use on face/ open                           Wound or open sores. Avoid contact with eyes, ears mouth and genitals (private parts).                       Wash face,  Genitals (private parts) with your normal soap.             6.  Wash thoroughly, paying special attention to the area where your surgery  will be performed.  7.  Thoroughly rinse your body with warm water from the neck down.  8.  DO NOT shower/wash with your normal soap after using and rinsing off  the CHG Soap.                9.  Pat yourself dry with a clean towel.            10.  Wear clean pajamas.            11.  Place clean sheets on your bed the night of your first shower and do not  sleep with pets. Day of Surgery : Do not apply any lotions/deodorants the morning of surgery.  Please wear clean clothes to the hospital/surgery center.  FAILURE TO FOLLOW THESE INSTRUCTIONS MAY RESULT IN THE CANCELLATION OF YOUR SURGERY PATIENT SIGNATURE_________________________________  NURSE SIGNATURE__________________________________  ________________________________________________________________________

## 2015-06-03 ENCOUNTER — Encounter (HOSPITAL_COMMUNITY): Payer: Self-pay

## 2015-06-03 ENCOUNTER — Encounter (HOSPITAL_COMMUNITY)
Admission: RE | Admit: 2015-06-03 | Discharge: 2015-06-03 | Disposition: A | Discharge status: Home / Self Care | Payer: Medicare Other | Source: Ambulatory Visit | Attending: Urology | Admitting: Urology

## 2015-06-03 DIAGNOSIS — Z85830 Personal history of malignant neoplasm of bone: Secondary | ICD-10-CM | POA: Diagnosis not present

## 2015-06-03 DIAGNOSIS — Z951 Presence of aortocoronary bypass graft: Secondary | ICD-10-CM | POA: Diagnosis not present

## 2015-06-03 DIAGNOSIS — I252 Old myocardial infarction: Secondary | ICD-10-CM | POA: Diagnosis not present

## 2015-06-03 DIAGNOSIS — N471 Phimosis: Secondary | ICD-10-CM | POA: Diagnosis present

## 2015-06-03 DIAGNOSIS — Z7982 Long term (current) use of aspirin: Secondary | ICD-10-CM | POA: Diagnosis not present

## 2015-06-03 DIAGNOSIS — Z955 Presence of coronary angioplasty implant and graft: Secondary | ICD-10-CM | POA: Diagnosis not present

## 2015-06-03 DIAGNOSIS — Z79899 Other long term (current) drug therapy: Secondary | ICD-10-CM | POA: Diagnosis not present

## 2015-06-03 DIAGNOSIS — Z8546 Personal history of malignant neoplasm of prostate: Secondary | ICD-10-CM | POA: Diagnosis not present

## 2015-06-03 DIAGNOSIS — I2510 Atherosclerotic heart disease of native coronary artery without angina pectoris: Secondary | ICD-10-CM | POA: Diagnosis not present

## 2015-06-03 DIAGNOSIS — F17200 Nicotine dependence, unspecified, uncomplicated: Secondary | ICD-10-CM | POA: Diagnosis not present

## 2015-06-03 DIAGNOSIS — M1990 Unspecified osteoarthritis, unspecified site: Secondary | ICD-10-CM | POA: Diagnosis not present

## 2015-06-03 DIAGNOSIS — Z8042 Family history of malignant neoplasm of prostate: Secondary | ICD-10-CM | POA: Diagnosis not present

## 2015-06-03 DIAGNOSIS — E039 Hypothyroidism, unspecified: Secondary | ICD-10-CM | POA: Diagnosis not present

## 2015-06-03 LAB — CBC
HEMATOCRIT: 42.9 % (ref 39.0–52.0)
HEMOGLOBIN: 14.2 g/dL (ref 13.0–17.0)
MCH: 30.6 pg (ref 26.0–34.0)
MCHC: 33.1 g/dL (ref 30.0–36.0)
MCV: 92.5 fL (ref 78.0–100.0)
PLATELETS: 242 10*3/uL (ref 150–400)
RBC: 4.64 MIL/uL (ref 4.22–5.81)
RDW: 13.7 % (ref 11.5–15.5)
WBC: 6.7 10*3/uL (ref 4.0–10.5)

## 2015-06-03 LAB — BASIC METABOLIC PANEL
Anion gap: 8 (ref 5–15)
BUN: 32 mg/dL — AB (ref 6–20)
CHLORIDE: 106 mmol/L (ref 101–111)
CO2: 27 mmol/L (ref 22–32)
CREATININE: 0.96 mg/dL (ref 0.61–1.24)
Calcium: 9.3 mg/dL (ref 8.9–10.3)
GFR calc Af Amer: >60 mL/min (ref >60)
GFR calc non Af Amer: >60 mL/min (ref >60)
GLUCOSE: 132 mg/dL — AB (ref 65–99)
Potassium: 4.7 mmol/L (ref 3.5–5.1)
SODIUM: 141 mmol/L (ref 135–145)

## 2015-06-03 NOTE — Progress Notes (Signed)
ekg 12-09-17 [facility] on chart lov dr Lamar Blinks 03-27-15 cardiology on chart Cardiac cath report 12-10-14 [facility] on chart Cardiac clearance dr Mauricio Po on chart for 06-05-15 surgery  patient new stent to [facility] card on chart

## 2015-06-04 NOTE — H&P (Signed)
History of Present Illness F/u - new pt for me. His PCP is Nonda Lou, [name] with [facility] in [place].   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 PSA recurrence and on androgen deprivation since 2007.  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 No LE weakness. he is on Ca + Vit D daily.  3 - PHimosis - impressive phimosis on exam, unable to retract foreskin all the way.   PMH sig for CAD/Stent/Plavix (follows [facility], Dr. Mauricio Po), Pericardial window, [place].   Today " George French " is seen in f/u above. He denies hematuria or new bone pain. He has developed obstructive voiding symptoms with a weak dribbling stream and ballooning of the foreskin with voiding. He was partially cleared for surgery by Dr. Mauricio Po. Patient with a drug-eluting stent August 2016 therefore Dr. Mauricio Po recommended continued Plavix for 3-6 more months.     Past Medical History Problems  1. History of Acute Myocardial Infarction 2. History of Arthritis 3. History of Cancer  Surgical History Problems  1. History of Heart Surgery 2. History of Prostate Surgery  Current Meds 1. Aspirin 81 MG TABS; 1 daily;  Therapy: (Recorded:21Sep2016) to Recorded 2. Atorvastatin Calcium 40 MG Oral Tablet; TAKE 1 TABLET DAILY;  Therapy: [phone] to Recorded 3. Calcium 600 + D TABS; 2 tablets daily;  Therapy: (Recorded:22Aug2016) to Recorded 4. Clopidogrel Bisulfate 75 MG Oral Tablet; TAKE 1 TABLET DAILY;  Therapy: [phone] to Recorded 5. Lisinopril 20 MG Oral Tablet; 1 tablet daily;  Therapy: 09Aug2010 to Recorded 6. Metoprolol Tartrate 25 MG Oral Tablet; 1 po  bid;  Therapy: [id number] to Recorded 7. Multi Vitamin/Minerals TABS; TAKE 1 TABLET DAILY;  Therapy: (Recorded:12Oct2012) to Recorded 8. Nitrostat 0.4 MG Sublingual Tablet Sublingual; prn chest pain;  Therapy: 12Aug2016 to Recorded 9. Probiotic Oral Capsule; 1 PO DAILY;  Therapy: 01Apr2016 to Recorded 10. Rosuvastatin Calcium 40 MG Oral Tablet;   Therapy: [id number] to Recorded  Allergies Medication  1. Neosporin OINT 2. Vaseline GEL  Family History Problems  1. Family history of Diabetes Mellitus : Mother 2. Family history of Family Health Status Number Of Children   2 3. Family history of Prostate Cancer : Father  Social History Problems  1. Denied: Alcohol Use (History) 2. Family history of Death In The Family Father   1975-07-13. Family history of Death In The Family Mother   61 4. Marital History - Currently Married 5. 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 Tobacco Use  Vitals [id number] 08:53AM  Blood Pressure: 136 / 75  Temperature: 97.2 F  Heart Rate: 49   Results/Data  Old records or history reviewed:. Selected Results  UA With REFLEX [id number] 07:49AM Festus Aloe  SPECIMEN TYPE: CLEAN CATCH   Test Name Result Flag Reference  COLOR YELLOW  YELLOW  ** PLEASE NOTE CHANGE IN UNIT OF MEASURE AND REFERENCE RANGE(S). **  APPEARANCE CLEAR  CLEAR  SPECIFIC GRAVITY 1.020  1.001-1.035  pH 6.0  5.0-8.0  GLUCOSE NEGATIVE  NEGATIVE  BILIRUBIN NEGATIVE  NEGATIVE  KETONE NEGATIVE  NEGATIVE  BLOOD NEGATIVE  NEGATIVE  PROTEIN NEGATIVE  NEGATIVE  NITRITE NEGATIVE  NEGATIVE  LEUKOCYTE ESTERASE NEGATIVE  NEGATIVE   Assessment Assessed  1. Phimosis (N47.1)  Discussion/Summary Phimosis-we discussed the nature risk and benefits of topicals, dorsal slit and circumcision. I discussed Dr. Beverlyn Roux is concerned about coming off the Plavix, stent occlusion and sudden death. We will hold Plavix for 3 days and 76 and continue his aspirin. We will plan for dorsal  slit. He would also be at risk for a buried penis with a circumcision.  Prostate cancer - will need Vantas Jun 2017.     Signatures Electronically signed by : Festus Aloe, M.D.; May 28 2015  3:39PM EST

## 2015-06-05 ENCOUNTER — Ambulatory Visit (HOSPITAL_COMMUNITY): Payer: Medicare Other | Admitting: Anesthesiology

## 2015-06-05 ENCOUNTER — Ambulatory Visit (HOSPITAL_COMMUNITY)
Admission: RE | Admit: 2015-06-05 | Discharge: 2015-06-05 | Disposition: A | Discharge status: Home / Self Care | Payer: Medicare Other | Source: Ambulatory Visit | Attending: Urology | Admitting: Urology

## 2015-06-05 ENCOUNTER — Encounter (HOSPITAL_COMMUNITY)
Admission: RE | Disposition: A | Discharge status: Home / Self Care | Payer: Self-pay | Source: Ambulatory Visit | Attending: Urology

## 2015-06-05 ENCOUNTER — Encounter (HOSPITAL_COMMUNITY): Payer: Self-pay | Admitting: *Deleted

## 2015-06-05 DIAGNOSIS — IMO0001 Reserved for inherently not codable concepts without codable children: Secondary | ICD-10-CM

## 2015-06-05 DIAGNOSIS — N471 Phimosis: Secondary | ICD-10-CM | POA: Insufficient documentation

## 2015-06-05 DIAGNOSIS — M1990 Unspecified osteoarthritis, unspecified site: Secondary | ICD-10-CM | POA: Diagnosis not present

## 2015-06-05 DIAGNOSIS — F17200 Nicotine dependence, unspecified, uncomplicated: Secondary | ICD-10-CM | POA: Insufficient documentation

## 2015-06-05 DIAGNOSIS — I252 Old myocardial infarction: Secondary | ICD-10-CM | POA: Diagnosis not present

## 2015-06-05 DIAGNOSIS — Z955 Presence of coronary angioplasty implant and graft: Secondary | ICD-10-CM | POA: Insufficient documentation

## 2015-06-05 DIAGNOSIS — Z79899 Other long term (current) drug therapy: Secondary | ICD-10-CM | POA: Insufficient documentation

## 2015-06-05 DIAGNOSIS — Z951 Presence of aortocoronary bypass graft: Secondary | ICD-10-CM | POA: Insufficient documentation

## 2015-06-05 DIAGNOSIS — Z8042 Family history of malignant neoplasm of prostate: Secondary | ICD-10-CM | POA: Insufficient documentation

## 2015-06-05 DIAGNOSIS — E039 Hypothyroidism, unspecified: Secondary | ICD-10-CM | POA: Insufficient documentation

## 2015-06-05 DIAGNOSIS — Z7982 Long term (current) use of aspirin: Secondary | ICD-10-CM | POA: Diagnosis not present

## 2015-06-05 DIAGNOSIS — I2510 Atherosclerotic heart disease of native coronary artery without angina pectoris: Secondary | ICD-10-CM | POA: Insufficient documentation

## 2015-06-05 DIAGNOSIS — Z85830 Personal history of malignant neoplasm of bone: Secondary | ICD-10-CM | POA: Insufficient documentation

## 2015-06-05 DIAGNOSIS — Z8546 Personal history of malignant neoplasm of prostate: Secondary | ICD-10-CM | POA: Insufficient documentation

## 2015-06-05 HISTORY — PX: CIRCUMCISION: SHX1350

## 2015-06-05 SURGERY — CIRCUMCISION, ADULT
Anesthesia: General

## 2015-06-05 MED ORDER — CLOPIDOGREL BISULFATE 75 MG PO TABS: 75.0000 mg | ORAL_TABLET | Freq: Every day | ORAL | Status: AC

## 2015-06-05 MED ORDER — ONDANSETRON HCL 4 MG/2ML IJ SOLN
INTRAMUSCULAR | Status: DC | PRN
  Administered 2015-06-05: 4 mg via INTRAVENOUS

## 2015-06-05 MED ORDER — EPHEDRINE SULFATE 50 MG/ML IJ SOLN
INTRAMUSCULAR | Status: DC | PRN
  Administered 2015-06-05 (×3): 5 mg via INTRAVENOUS
  Administered 2015-06-05: 2.5 mg via INTRAVENOUS
  Administered 2015-06-05: 10 mg via INTRAVENOUS

## 2015-06-05 MED ORDER — BUPIVACAINE HCL (PF) 0.25 % IJ SOLN
INTRAMUSCULAR | Status: AC
  Filled 2015-06-05: qty 30

## 2015-06-05 MED ORDER — FENTANYL CITRATE (PF) 100 MCG/2ML IJ SOLN
INTRAMUSCULAR | Status: AC
  Filled 2015-06-05: qty 2

## 2015-06-05 MED ORDER — LACTATED RINGERS IV SOLN
INTRAVENOUS | Status: DC
  Administered 2015-06-05: 14:00:00 via INTRAVENOUS
  Administered 2015-06-05: 1000 mL via INTRAVENOUS

## 2015-06-05 MED ORDER — CEFAZOLIN SODIUM-DEXTROSE 2-3 GM-% IV SOLR
2.0000 g | INTRAVENOUS | Status: AC
  Administered 2015-06-05: 2 g via INTRAVENOUS

## 2015-06-05 MED ORDER — EPHEDRINE SULFATE 50 MG/ML IJ SOLN
INTRAMUSCULAR | Status: AC
  Filled 2015-06-05: qty 1

## 2015-06-05 MED ORDER — FENTANYL CITRATE (PF) 100 MCG/2ML IJ SOLN: 25.0000 ug | INTRAMUSCULAR | Status: DC | PRN

## 2015-06-05 MED ORDER — PROPOFOL 10 MG/ML IV BOLUS
INTRAVENOUS | Status: AC
  Filled 2015-06-05: qty 20

## 2015-06-05 MED ORDER — LIDOCAINE HCL (CARDIAC) 20 MG/ML IV SOLN
INTRAVENOUS | Status: DC | PRN
Start: 2015-06-05 — End: 2015-06-05
  Administered 2015-06-05: 80 mg via INTRAVENOUS

## 2015-06-05 MED ORDER — DEXAMETHASONE SODIUM PHOSPHATE 4 MG/ML IJ SOLN
INTRAMUSCULAR | Status: DC | PRN
  Administered 2015-06-05: 5 mg via INTRAVENOUS

## 2015-06-05 MED ORDER — DEXAMETHASONE SODIUM PHOSPHATE 10 MG/ML IJ SOLN
INTRAMUSCULAR | Status: AC
  Filled 2015-06-05: qty 1

## 2015-06-05 MED ORDER — CEFAZOLIN SODIUM-DEXTROSE 2-3 GM-% IV SOLR
INTRAVENOUS | Status: AC
  Filled 2015-06-05: qty 50

## 2015-06-05 MED ORDER — FENTANYL CITRATE (PF) 100 MCG/2ML IJ SOLN
INTRAMUSCULAR | Status: DC | PRN
  Administered 2015-06-05 (×2): 25 ug via INTRAVENOUS
  Administered 2015-06-05: 50 ug via INTRAVENOUS

## 2015-06-05 MED ORDER — PROPOFOL 10 MG/ML IV BOLUS
INTRAVENOUS | Status: DC | PRN
  Administered 2015-06-05: 40 mg via INTRAVENOUS
  Administered 2015-06-05: 20 mg via INTRAVENOUS
  Administered 2015-06-05: 160 mg via INTRAVENOUS
  Administered 2015-06-05: 20 mg via INTRAVENOUS

## 2015-06-05 MED ORDER — ONDANSETRON HCL 4 MG/2ML IJ SOLN
INTRAMUSCULAR | Status: AC
  Filled 2015-06-05: qty 2

## 2015-06-05 MED ORDER — SODIUM CHLORIDE 0.9 % IJ SOLN
INTRAMUSCULAR | Status: AC
  Filled 2015-06-05: qty 10

## 2015-06-05 MED ORDER — PROMETHAZINE HCL 25 MG/ML IJ SOLN: 6.2500 mg | INTRAMUSCULAR | Status: DC | PRN

## 2015-06-05 SURGICAL SUPPLY — 25 items
BLADE SURG 15 STRL LF DISP TIS (BLADE) ×1 IMPLANT
BLADE SURG 15 STRL SS (BLADE) ×2
BNDG COHESIVE 1X5 TAN STRL LF (GAUZE/BANDAGES/DRESSINGS) IMPLANT
BNDG CONFORM 2 STRL LF (GAUZE/BANDAGES/DRESSINGS) IMPLANT
COVER SURGICAL LIGHT HANDLE (MISCELLANEOUS) IMPLANT
DRAPE LAPAROTOMY T 102X78X121 (DRAPES) ×3 IMPLANT
ELECT PENCIL ROCKER SW 15FT (MISCELLANEOUS) ×3 IMPLANT
ELECT REM PT RETURN 9FT ADLT (ELECTROSURGICAL) ×3
ELECTRODE REM PT RTRN 9FT ADLT (ELECTROSURGICAL) ×1 IMPLANT
GAUZE PETROLATUM 1 X8 (GAUZE/BANDAGES/DRESSINGS) IMPLANT
GAUZE SPONGE 4X4 16PLY XRAY LF (GAUZE/BANDAGES/DRESSINGS) ×3 IMPLANT
GLOVE BIOGEL M STRL SZ7.5 (GLOVE) ×9 IMPLANT
GOWN STRL REUS W/TWL XL LVL3 (GOWN DISPOSABLE) ×6 IMPLANT
KIT BASIN OR (CUSTOM PROCEDURE TRAY) ×3 IMPLANT
NEEDLE HYPO 22GX1.5 SAFETY (NEEDLE) IMPLANT
NS IRRIG 1000ML POUR BTL (IV SOLUTION) ×3 IMPLANT
PACK BASIC VI WITH GOWN DISP (CUSTOM PROCEDURE TRAY) ×3 IMPLANT
PAD TELFA 2X3 NADH STRL (GAUZE/BANDAGES/DRESSINGS) IMPLANT
SUT CHROMIC 3 0 PS 2 (SUTURE) ×3 IMPLANT
SUT CHROMIC 3 0 SH 27 (SUTURE) IMPLANT
SUT MNCRL AB 3-0 PS2 18 (SUTURE) ×6 IMPLANT
SYR CONTROL 10ML LL (SYRINGE) IMPLANT
TOWEL OR 17X26 10 PK STRL BLUE (TOWEL DISPOSABLE) ×3 IMPLANT
TOWEL OR NON WOVEN STRL DISP B (DISPOSABLE) ×3 IMPLANT
WATER STERILE IRR 1500ML POUR (IV SOLUTION) IMPLANT

## 2015-06-05 NOTE — Anesthesia Preprocedure Evaluation (Addendum)
Anesthesia Evaluation  Patient identified by MRN, date of birth, ID band Patient awake    Reviewed: Allergy & Precautions, NPO status , Patient's Chart, lab work & pertinent test results  Airway Mallampati: II  TM Distance: >3 FB Neck ROM: Full    Dental  (+) Edentulous Lower, Edentulous Upper   Pulmonary shortness of breath, former smoker,    Pulmonary exam normal breath sounds clear to auscultation       Cardiovascular Exercise Tolerance: Good hypertension, Pt. on medications and Pt. on home beta blockers + CAD and + Past MI  Normal cardiovascular exam Rhythm:Regular Rate:Normal  Clearance Dr. Mauricio Po  DES to LAD on 12-10-14; No current symptoms. Off plavix for four days. Still on ASA   Neuro/Psych negative neurological ROS  negative psych ROS   GI/Hepatic negative GI ROS, Neg liver ROS,   Endo/Other  negative endocrine ROS  Renal/GU negative Renal ROS  negative genitourinary   Musculoskeletal  (+) Arthritis ,   Abdominal (+) + obese,   Peds negative pediatric ROS (+)  Hematology negative hematology ROS (+)   Anesthesia Other Findings   Reproductive/Obstetrics negative OB ROS                            Anesthesia Physical Anesthesia Plan  ASA: III  Anesthesia Plan: General   Post-op Pain Management:    Induction: Intravenous  Airway Management Planned: LMA  Additional Equipment:   Intra-op Plan:   Post-operative Plan: Extubation in OR  Informed Consent: I have reviewed the patients History and Physical, chart, labs and discussed the procedure including the risks, benefits and alternatives for the proposed anesthesia with the patient or authorized representative who has indicated his/her understanding and acceptance.   Dental advisory given  Plan Discussed with: CRNA  Anesthesia Plan Comments:         Anesthesia Quick Evaluation

## 2015-06-05 NOTE — Discharge Instructions (Signed)
Dorsal Slit, Adult, Care After Refer to this sheet in the next few weeks. These instructions provide you with information on caring for yourself after your procedure. Your health care provider may also give you more specific instructions. Your treatment has been planned according to current medical practices, but problems sometimes occur. Call your health care provider if you have any problems or questions after your procedure. WHAT TO EXPECT AFTER THE PROCEDURE After your procedure, it is typical to have the following sensations:  Redness at the incision site.  Soreness at the incision site.  Slight swelling around the incision. HOME CARE INSTRUCTIONS  Take all medicines as directed by your health care provider.  Any dressings should stay on for at least 24 hours. You may take the dressing off at night to let air circulate around the incision. Once a scab has formed over the incision, you no longer need to cover your incision with a dressing.  Carefully remove the bandage if it gets dirty. Apply medicated cream to the incision. Carefully put a new bandage on if a scab has not formed.  Do not have sex until your health care provider says it is okay.  Do not get your incision site wet for 24 hours or as told by your health care provider.  You may take a sponge bath. Clean around the incision site gently with mild soap and water.  You may take a shower after 24 hours or as told by your health care provider. Do not take a tub bath. After you shower, gently pat dry the incision site. Do not rub it.  Avoid heavy lifting.  Avoid contact sports, biking, or swimming until you have healed. This will usually be between 10 days and 14 days after the procedure. SEEK MEDICAL CARE IF:  You are experiencing pain that is not relieved by your medicine.  You have swelling or redness that is unexpected.  You develop a fever. SEEK IMMEDIATE MEDICAL CARE IF:  You cannot urinate.  You have pain  when you urinate.  Your pain is not helped by medicines.  There is redness, swelling, and pain (inflammation) spreading up the shaft of your penis, thighs, or lower abdomen.  There is pus coming from your incision.  You have bleeding that does not stop when you press on it.   This information is not intended to replace advice given to you by your health care provider. Make sure you discuss any questions you have with your health care provider.   Document Released: 01/30/2013 Document Revised: 05/02/2014 Document Reviewed: 01/30/2013 Elsevier Interactive Patient Education [employer].

## 2015-06-05 NOTE — Interval H&P Note (Signed)
History and Physical Interval Note:  06/05/2015 12:23 PM  George Anger Sr.  has presented today for surgery, with the diagnosis of PHIMOSIS  The various methods of treatment have been discussed with the patient and family. After consideration of risks, benefits and other options for treatment, the patient has consented to  Procedure(s):  DORSAL SLIT (N/A) as a surgical intervention .  The patient's history has been reviewed, patient examined, no change in status, stable for surgery.  I have reviewed the patient's chart and labs.  Questions were answered to the patient's satisfaction.     [name]

## 2015-06-05 NOTE — Transfer of Care (Signed)
Immediate Anesthesia Transfer of Care Note  Patient: George Anger Sr.  Procedure(s) Performed: Procedure(s):  DORSAL SLIT (N/A)  Patient Location: PACU  Anesthesia Type:General  Level of Consciousness: awake, alert , oriented and patient cooperative  Airway & Oxygen Therapy: Patient Spontanous Breathing and Patient connected to face mask oxygen  Post-op Assessment: Report given to RN and Post -op Vital signs reviewed and stable  Post vital signs: Reviewed and stable  Last Vitals:  Filed Vitals:   06/05/15 1142  BP: 139/65  Pulse: 53  Temp: 36.6 C  Resp: 16    Complications: No apparent anesthesia complications

## 2015-06-05 NOTE — Anesthesia Procedure Notes (Signed)
Procedure Name: LMA Insertion Date/Time: 06/05/2015 1:38 PM Performed by: Raenette Rover Pre-anesthesia Checklist: Patient identified, Emergency Drugs available, Suction available and Patient being monitored Patient Re-evaluated:Patient Re-evaluated prior to inductionOxygen Delivery Method: Circle system utilized Preoxygenation: Pre-oxygenation with 100% oxygen Intubation Type: IV induction Ventilation: Mask ventilation without difficulty LMA: LMA inserted LMA Size: 4.0 Number of attempts: 1 Placement Confirmation: positive ETCO2,  CO2 detector and breath sounds checked- equal and bilateral Tube secured with: Tape Dental Injury: Teeth and Oropharynx as per pre-operative assessment

## 2015-06-05 NOTE — Op Note (Signed)
Preoperative diagnosis: Phimosis   postoperative diagnosis: Phimosis   procedure: Dorsal slit  Surgeon: Junious Silk  Anesthesia: Gen.  Indication: 75 year old with significant phimosis and trouble voiding.  Findings: Normal foreskin without lesion.  Description of procedure: After consent was obtained patient brought to the operating room. After adequate anesthesia he was prepped and draped in the usual sterile fashion. He was in supine position. I did dilate the final moderate foreskin to allow a straight hemostat to be passed. The straight hemostat was applied dorsally at 12:00 and the skin incised after dipping crush clamped. The glans penis was adherent to the foreskin underneath and the glans was swept free of the foreskin. The dorsal slit was then taken back which allowed the glans to be free and the foreskin to slide back and forth without phimosis. The glans and inner foreskin were clean with a 50-50 Betadine water mixture. The dorsal slit was then run closed from 12:00 out to the right and then the left with a 3-0 chromic suture. Hemostasis was excellent. A Telfa fluffs and mesh underwear were placed. He was awakened taken to recovery room in stable condition.  Complications: None  Blood loss: Minimal  Drains: None  Specimens: None

## 2015-06-06 NOTE — Anesthesia Postprocedure Evaluation (Signed)
Anesthesia Post Note  Patient: George French.  Procedure(s) Performed: Procedure(s) (LRB):  DORSAL SLIT (N/A)  Patient location during evaluation: PACU Anesthesia Type: General Level of consciousness: awake and alert Pain management: pain level controlled Vital Signs Assessment: post-procedure vital signs reviewed and stable Respiratory status: spontaneous breathing, nonlabored ventilation, respiratory function stable and patient connected to nasal cannula oxygen Cardiovascular status: blood pressure returned to baseline and stable Postop Assessment: no signs of nausea or vomiting Anesthetic complications: no    Last Vitals:  Filed Vitals:   06/05/15 1538 06/05/15 1730  BP: 120/56 140/70  Pulse: 50 57  Temp: 36.4 C   Resp: 14 16    Last Pain:  Filed Vitals:   06/05/15 1901  PainSc: 0-No pain                 Ege Muckey J

## 2015-06-08 ENCOUNTER — Encounter (HOSPITAL_COMMUNITY): Payer: Self-pay | Admitting: Urology

## 2015-10-21 ENCOUNTER — Ambulatory Visit (HOSPITAL_COMMUNITY)
Admission: RE | Admit: 2015-10-21 | Discharge: 2015-10-21 | Disposition: A | Discharge status: Home / Self Care | Payer: Medicare Other | Source: Ambulatory Visit | Attending: Urology | Admitting: Urology

## 2015-10-21 ENCOUNTER — Other Ambulatory Visit: Payer: Self-pay | Admitting: Urology

## 2015-10-21 DIAGNOSIS — R948 Abnormal results of function studies of other organs and systems: Secondary | ICD-10-CM | POA: Insufficient documentation

## 2015-10-21 DIAGNOSIS — C61 Malignant neoplasm of prostate: Secondary | ICD-10-CM | POA: Insufficient documentation

## 2015-10-21 DIAGNOSIS — M5136 Other intervertebral disc degeneration, lumbar region: Secondary | ICD-10-CM | POA: Insufficient documentation

## 2015-10-21 DIAGNOSIS — M5137 Other intervertebral disc degeneration, lumbosacral region: Secondary | ICD-10-CM | POA: Diagnosis not present

## 2015-10-21 DIAGNOSIS — M4806 Spinal stenosis, lumbar region: Secondary | ICD-10-CM | POA: Diagnosis not present

## 2015-10-21 DIAGNOSIS — C7951 Secondary malignant neoplasm of bone: Secondary | ICD-10-CM | POA: Diagnosis present

## 2015-10-21 LAB — POCT I-STAT CREATININE: CREATININE: 0.9 mg/dL (ref 0.61–1.24)

## 2015-10-21 MED ORDER — GADOBENATE DIMEGLUMINE 529 MG/ML IV SOLN
20.0000 mL | Freq: Once | INTRAVENOUS | Status: AC | PRN
Start: 2015-10-21 — End: 2015-10-21
  Administered 2015-10-21: 20 mL via INTRAVENOUS

## 2017-03-11 IMAGING — MR MR LUMBAR SPINE WO/W CM
4 of 7 series · 18 of 48 positions shown · IV contrast (multihance)
Comparison: Bone scan 07/03/2014.  CT abdomen 07/31/2013.

CLINICAL DATA: History of prostate and lung cancer. Abnormal bone
scan at T11.

EXAM:
MRI LUMBAR SPINE WITHOUT AND WITH CONTRAST
TECHNIQUE: Multiplanar and multiecho pulse sequences of the lumbar spine were
obtained without and with intravenous contrast.
CONTRAST:  20mL MULTIHANCE GADOBENATE DIMEGLUMINE 529 MG/ML IV SOLN

[Series 3: T1 · sagittal · 4.0mm · 0.59mm/px · 3 of 15 slices shown (1 of 2)]
[im 1/15]
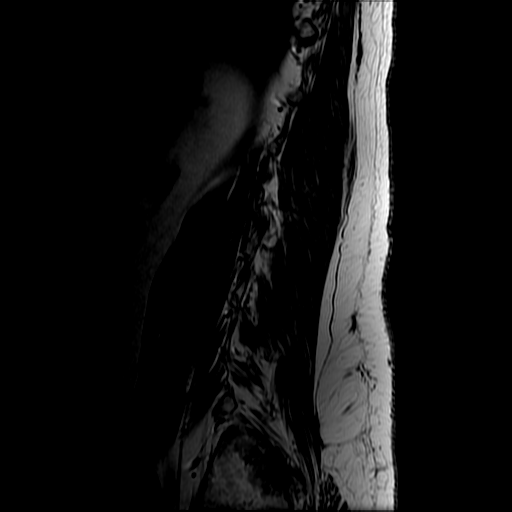
[im 8/15]
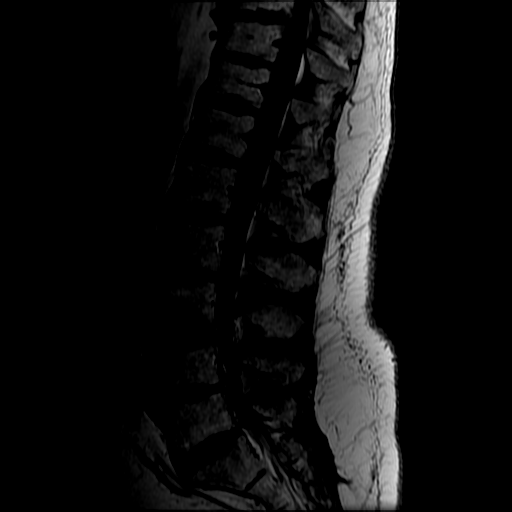
[im 15/15]
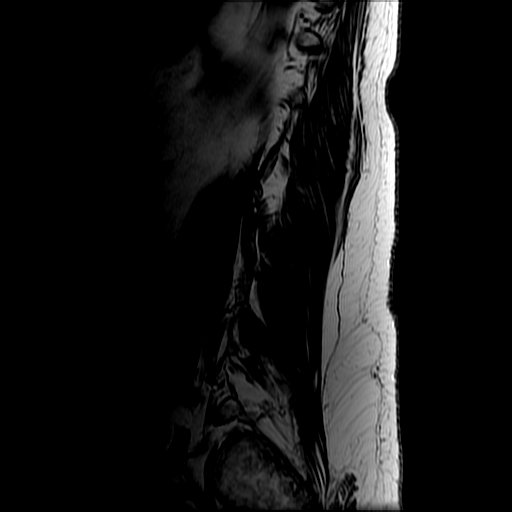

[Series 5: T2 · axial · 4.0mm · 0.39mm/px · z∈[-94,+179]mm · 9 of 54 slices shown]
[im 1/54]
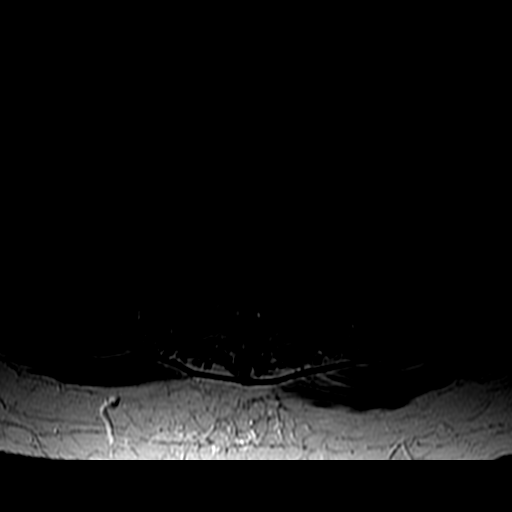
[im 10/54]
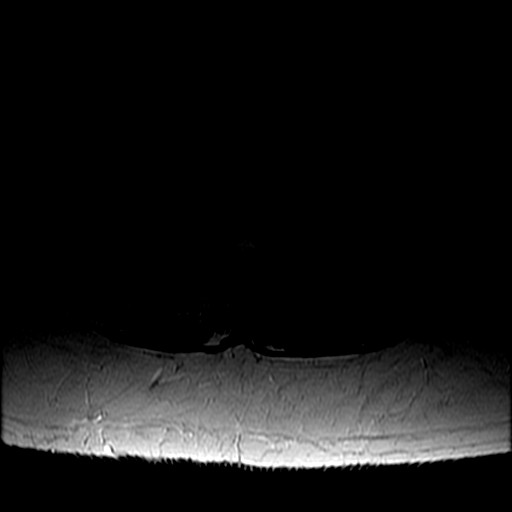
[im 15/54]
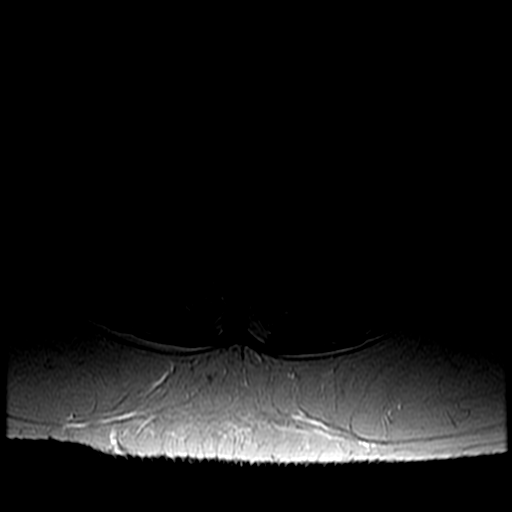
[im 25/54]
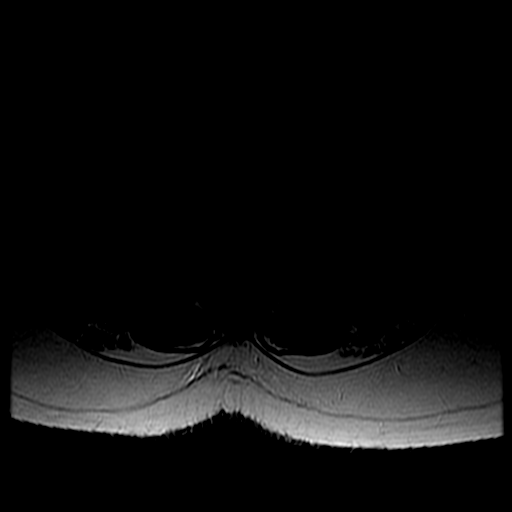
[im 29/54]
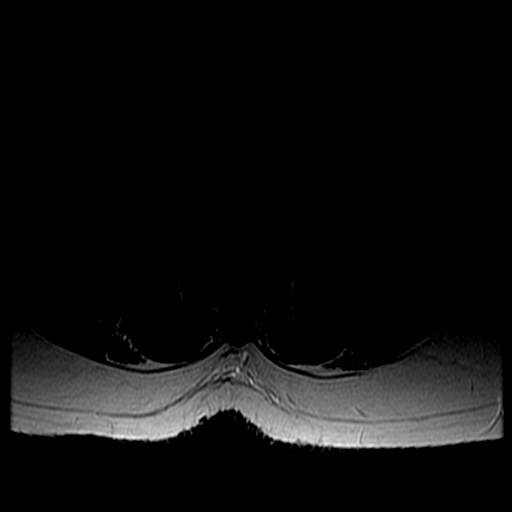
[im 39/54]
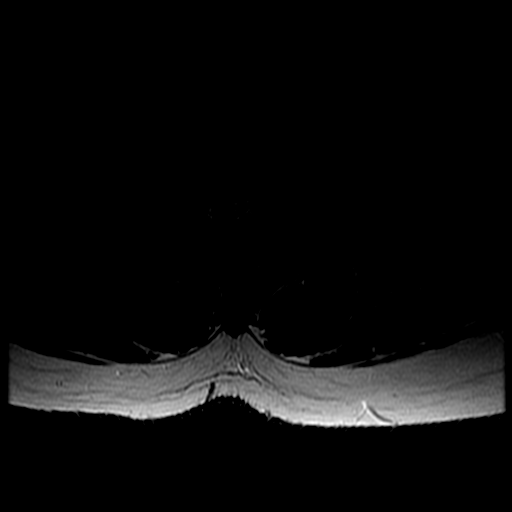
[im 44/54]
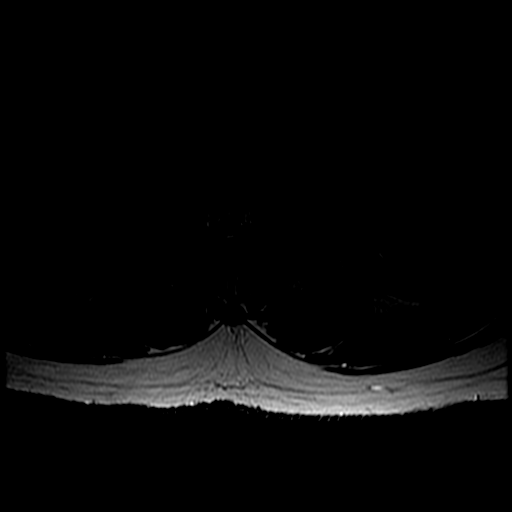
[im 49/54]
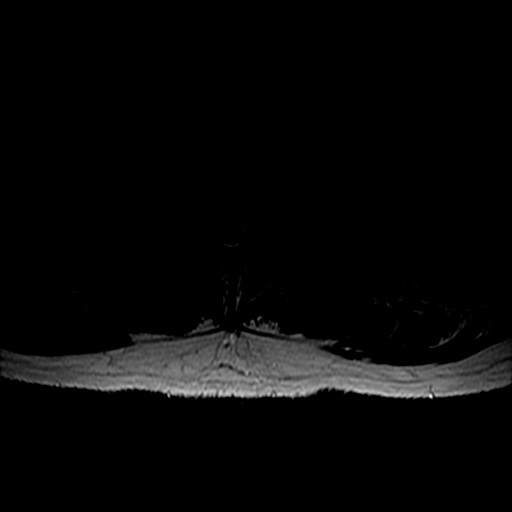
[im 54/54]
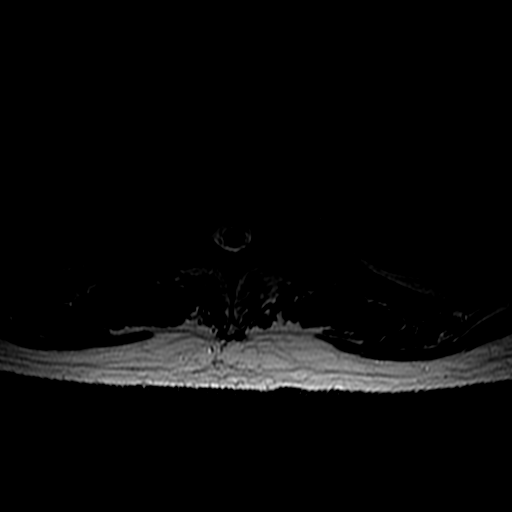

[Series 6: T1 · axial · 4.0mm · 0.39mm/px · z∈[-49,+154]mm · 3 of 54 slices shown (2 of 2)]
[im 10/54]
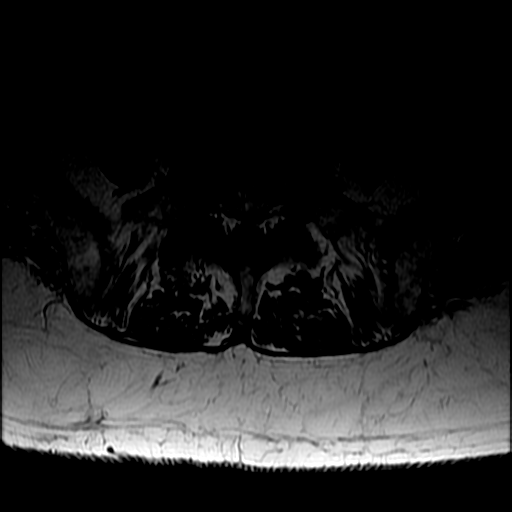
[im 29/54]
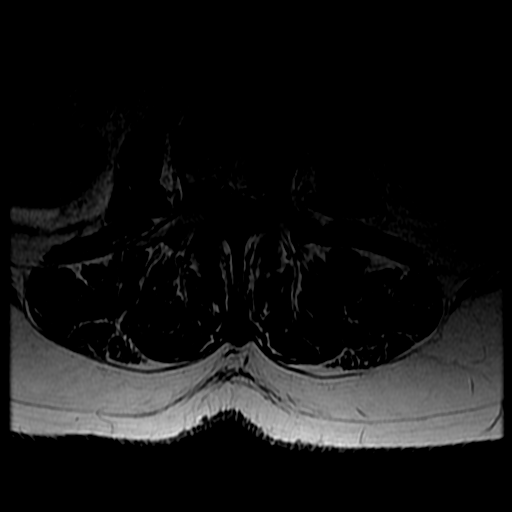
[im 49/54]
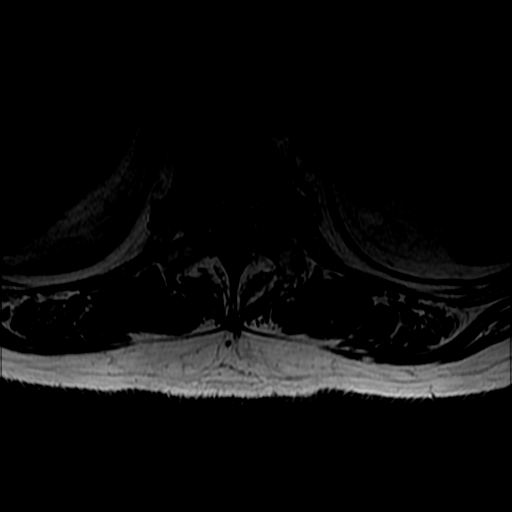

[Series 7: T2 post-contrast · sagittal · 4.0mm · 0.51mm/px · 3 of 12 slices shown]
[im 1/12]
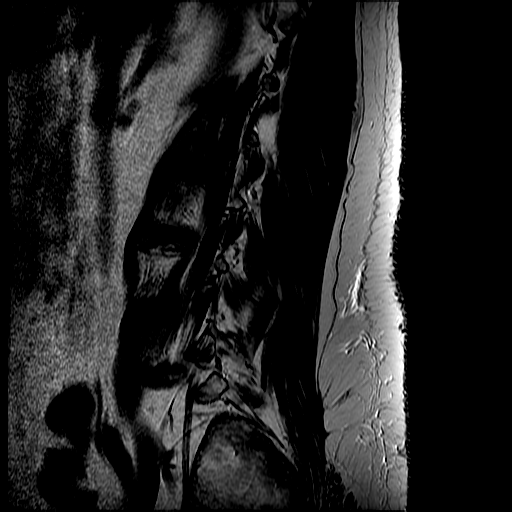
[im 6/12]
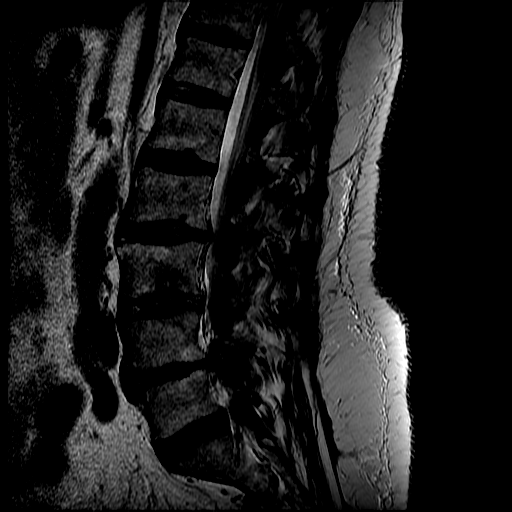
[im 12/12]
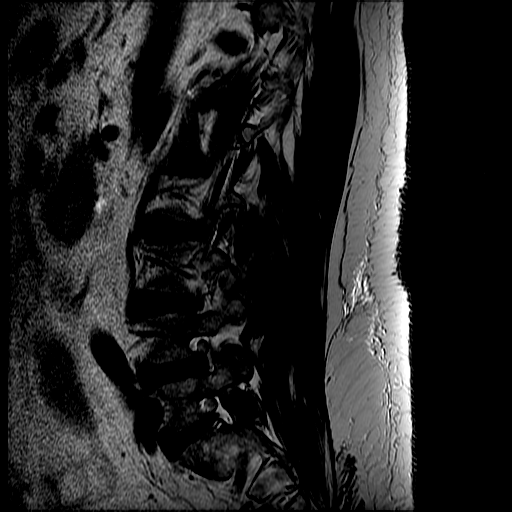

[18 of 48 positions shown; findings below may reference images not displayed]

FINDINGS: Segmentation: 5 lumbar type vertebral bodies. The examination covers
the region from the inferior endplate of T9 through S2.

Alignment:  Normal

Vertebrae: At the inferior endplate of T9 anteriorly and towards the
left, there is a marrow space lesion that could represent a
metastasis. That level was not completely evaluated. At the inferior
endplate region of L3, there is what appears to be an acute
Schmorl's node with enhancement. Much less likely this represents a
metastatic deposit. No other regional marrow abnormality.

Conus medullaris: Extends to the L1 level and appears normal.

Paraspinal and other soft tissues: Benign appearing renal cyst on
the left.

Disc levels:

No significant finding at L1-2 or above.

L2-3: Desiccation and circumferential bulging of the disc. Facet and
ligamentous hypertrophy. Moderate multifactorial stenosis at this
level could be symptomatic.

L3-4: Bulging of the disc. Mild facet and ligamentous hypertrophy.
Mild narrowing of both lateral recesses but without visible neural
compression.

L4-5: Circumferential bulging of the disc. Facet and ligamentous
hypertrophy. Moderate canal stenosis with potential for neural
compression in both lateral recesses.

L5-S1: Bulging of the disc. Bilateral facet degeneration and
hypertrophy. Mild narrowing of the subarticular lateral recesses. No
visible neural compression.
IMPRESSION: At the inferior endplate of T9 anteriorly and towards the left,
there is a marrow space abnormality suspicious for a metastasis.
This area was not completely evaluated on this lumbar exam.

Inferior endplate abnormality at L3 most consistent with an acute
Schmorl's node. This could be associated with back pain. The
possibility that this represents a metastasis is much more remote.

Degenerative disc disease in the lumbar region from L2-3 through
L5-S1. There is spinal stenosis at L2-3, the lateral recesses at
L3-4, at L4-5 in the lateral recesses at L5-S1 that could cause
neural compression. Certainly, the degenerative disc disease and
facet degenerative changes could be associated with back pain.

## 2024-02-24 DEATH — deceased
